# Patient Record
Sex: Female | Born: 1955 | Race: White | Hispanic: No | Marital: Single | Smoking: Former smoker
Health system: Southern US, Community
[De-identification: ages and names within clinical notes are randomized; demographics above are authoritative.]

## PROBLEM LIST (undated history)

## (undated) DIAGNOSIS — C801 Malignant (primary) neoplasm, unspecified: Secondary | ICD-10-CM

## (undated) DIAGNOSIS — E079 Disorder of thyroid, unspecified: Secondary | ICD-10-CM

## (undated) DIAGNOSIS — I509 Heart failure, unspecified: Secondary | ICD-10-CM

## (undated) DIAGNOSIS — I341 Nonrheumatic mitral (valve) prolapse: Secondary | ICD-10-CM

## (undated) DIAGNOSIS — E039 Hypothyroidism, unspecified: Secondary | ICD-10-CM

## (undated) DIAGNOSIS — K2970 Gastritis, unspecified, without bleeding: Secondary | ICD-10-CM

## (undated) DIAGNOSIS — F329 Major depressive disorder, single episode, unspecified: Secondary | ICD-10-CM

## (undated) DIAGNOSIS — J439 Emphysema, unspecified: Secondary | ICD-10-CM

## (undated) DIAGNOSIS — F32A Depression, unspecified: Secondary | ICD-10-CM

## (undated) DIAGNOSIS — F419 Anxiety disorder, unspecified: Secondary | ICD-10-CM

## (undated) HISTORY — DX: Major depressive disorder, single episode, unspecified: F32.9

## (undated) HISTORY — PX: COLON SURGERY: SHX602

## (undated) HISTORY — DX: Anxiety disorder, unspecified: F41.9

## (undated) HISTORY — PX: UPPER GI ENDOSCOPY: SHX6162

## (undated) HISTORY — PX: ILEOSTOMY: SHX1783

## (undated) HISTORY — DX: Depression, unspecified: F32.A

## (undated) HISTORY — PX: THYROID SURGERY: SHX805

## (undated) HISTORY — DX: Heart failure, unspecified: I50.9

## (undated) HISTORY — PX: COLOSTOMY REVERSAL: SHX5782

## (undated) HISTORY — PX: COLOSTOMY: SHX63

## (undated) HISTORY — PX: BLADDER REMOVAL: SHX567

## (undated) HISTORY — PX: COLONOSCOPY: SHX174

## (undated) HISTORY — DX: Malignant (primary) neoplasm, unspecified: C80.1

---

## 2001-05-10 DIAGNOSIS — I219 Acute myocardial infarction, unspecified: Secondary | ICD-10-CM

## 2001-05-10 HISTORY — DX: Acute myocardial infarction, unspecified: I21.9

## 2007-11-30 ENCOUNTER — Ambulatory Visit: Payer: Self-pay | Admitting: Internal Medicine

## 2011-02-02 ENCOUNTER — Inpatient Hospital Stay: Payer: Self-pay | Admitting: Surgery

## 2011-02-04 LAB — CEA: CEA: 1.9 ng/mL (ref 0.0–4.7)

## 2011-02-04 LAB — CA 125: CA 125: 94.3 U/mL — ABNORMAL HIGH (ref 0.0–34.0)

## 2011-03-13 ENCOUNTER — Ambulatory Visit: Payer: Self-pay | Admitting: Urology

## 2011-04-08 ENCOUNTER — Ambulatory Visit: Payer: Self-pay | Admitting: Surgery

## 2011-07-23 ENCOUNTER — Ambulatory Visit: Payer: Self-pay | Admitting: Family Medicine

## 2011-09-06 ENCOUNTER — Emergency Department: Payer: Self-pay | Admitting: *Deleted

## 2011-09-06 LAB — COMPREHENSIVE METABOLIC PANEL
Albumin: 3.3 g/dL — ABNORMAL LOW (ref 3.4–5.0)
Alkaline Phosphatase: 122 U/L (ref 50–136)
BUN: 13 mg/dL (ref 7–18)
Bilirubin,Total: 0.3 mg/dL (ref 0.2–1.0)
Co2: 26 mmol/L (ref 21–32)
EGFR (African American): >60
EGFR (Non-African Amer.): >60
Osmolality: 279 (ref 275–301)
Potassium: 3.7 mmol/L (ref 3.5–5.1)
SGOT(AST): 18 U/L (ref 15–37)
Total Protein: 7.9 g/dL (ref 6.4–8.2)

## 2011-09-06 LAB — CBC
MCH: 28.4 pg (ref 26.0–34.0)
MCHC: 31.9 g/dL — ABNORMAL LOW (ref 32.0–36.0)
MCV: 89 fL (ref 80–100)
Platelet: 829 10*3/uL — ABNORMAL HIGH (ref 150–440)
RBC: 3.19 10*6/uL — ABNORMAL LOW (ref 3.80–5.20)
RDW: 14.8 % — ABNORMAL HIGH (ref 11.5–14.5)

## 2012-06-21 IMAGING — XA DG CHEST 1V
1 series · 1 of 1 positions shown · non-contrast
Comparison: none

REASON FOR EXAM: PICC line Placement
COMMENTS:

PROCEDURE:     VAS - CHEST FRONTAL SINGLE VIEW  - February 19, 2011 [DATE]
RESULT:     Frontal view of the mediastinum is performed.
The patient is status post left sided PICC line placement. The tip is
adequately positioned. The study is otherwise nondiagnostic.

[Series 1: fl - angio · 1 of 1 slices shown]
[im 1/1]
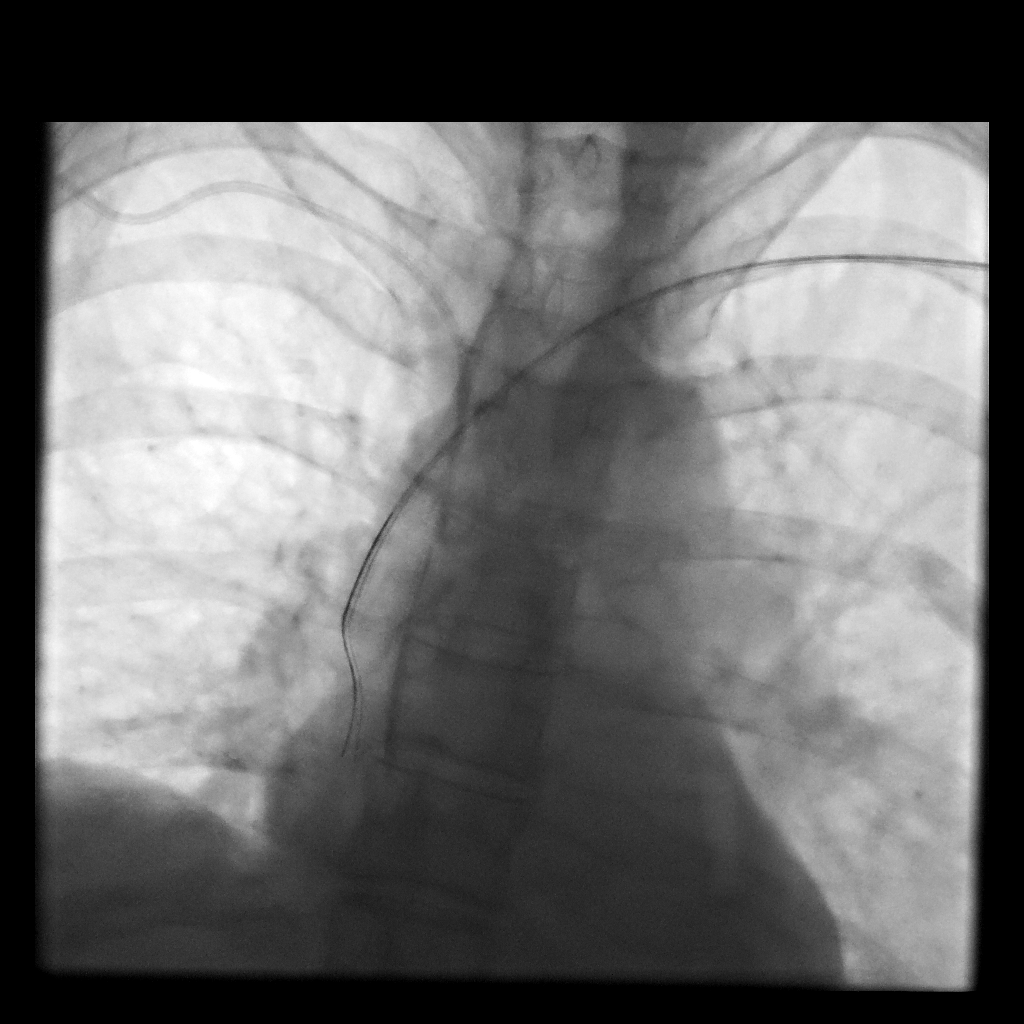

[1 of 1 positions shown; findings below may reference images not displayed]

IMPRESSION: Left sided PICC line placement as described above.

## 2012-07-02 IMAGING — CT CT GUIDED ABCESS DRAINAGE WITH CATHETER
1 of 6 series · 2 of 16 positions shown, 3 images · non-contrast
Comparison: none

REASON FOR EXAM: left upper quadrant fluid collection.
COMMENTS:

[Series 6: soft tissue · axial · 0.78mm/px · z∈[-1014,-946]mm · 2 of 71 slices shown, 3 images]
[im 24/71  soft-tissue]
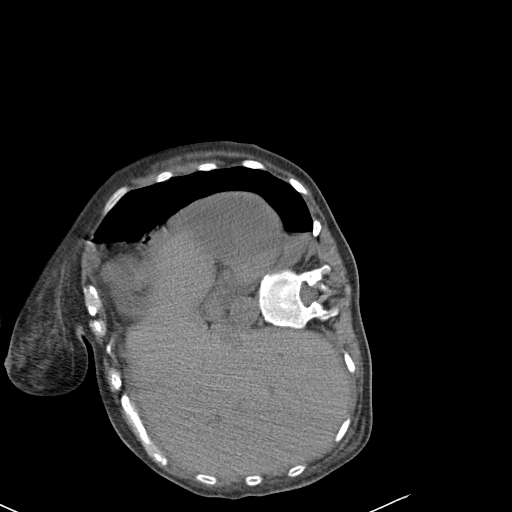
[im 24/71  bone]
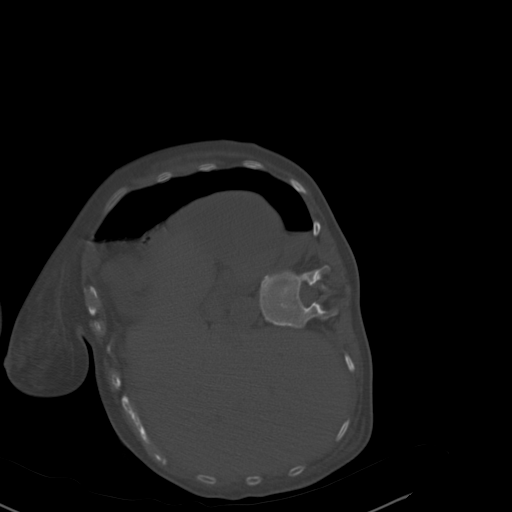
[im 47/71  soft-tissue]
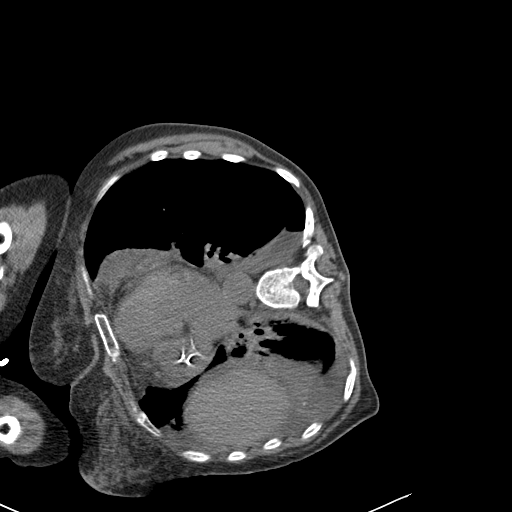

[2 of 16 positions shown; findings below may reference images not displayed]

PROCEDURE:     CT  - CT GUIDED ABSCESS DRAINAGE  - March 02, 2011 [DATE]

RESULT:     Comparisons: CT of the abdomen and pelvis 03/01/2011

Procedure:

Clinical assessment was performed and informed consent obtained.  The
patient was brought to the CT suite and placed left side up decubitus on the
CT table. A focused upper abdominal CT without contrast was performed. This
demonstrated the small subdiaphragmatic fluid collection in the left upper
quadrant, anterior to the spleen. There are small bilateral pleural
effusions. Care was taken to choose an access site that would minimize the
possibility of transgressing the pleural space. However, this was difficult
given its subdiaphragmatic location.

The left upper hemiabdomen was prepped and draped in the usual sterile
fashion. The overlying skin was anesthetized with 1% lidocaine. A 21 gauge
micropuncture needle was inserted and the location confirmed by CT
fluoroscopy. The needle was advanced and placement within the collection
confirmed by CT and syringe aspiration of yellow fluid which was sent for
gram stain and culture.

The 21 gauge needle was exchanged for a 5 French AccuStick catheter over a
wire. Serial dilation was performed with 8 French and 10 French dilators,
followed by placement of a 10 French APDL pigtail catheter into the fluid
collection. The catheter was secured to the skin in the usual fashion. A
postprocedural CT images demonstrated a small left pneumothorax. The patient
was asymptomatic.

The patient was transferred to the recovery unit in stable condition.

Sedation: 0 mg of midazolam and 0 mcg of fentanyl.
IMPRESSION: 1. Successful percutaneous drainage of the subdiaphragmatic fluid collection
in the left upper quadrant.
2. Small left pneumothorax. The patient is asymmetric at this time. This was
discussed with Dr. Rupsu Landgren immediately after the procedure.

## 2012-08-11 ENCOUNTER — Emergency Department: Payer: Self-pay | Admitting: Emergency Medicine

## 2012-08-11 LAB — COMPREHENSIVE METABOLIC PANEL
Anion Gap: 6 — ABNORMAL LOW (ref 7–16)
BUN: 17 mg/dL (ref 7–18)
Bilirubin,Total: 0.3 mg/dL (ref 0.2–1.0)
Calcium, Total: 9.6 mg/dL (ref 8.5–10.1)
Glucose: 87 mg/dL (ref 65–99)
Osmolality: 280 (ref 275–301)
Potassium: 4 mmol/L (ref 3.5–5.1)
SGOT(AST): 28 U/L (ref 15–37)
SGPT (ALT): 25 U/L (ref 12–78)

## 2012-08-11 LAB — CBC
HCT: 37.4 % (ref 35.0–47.0)
HGB: 12.2 g/dL (ref 12.0–16.0)
MCHC: 32.7 g/dL (ref 32.0–36.0)
MCV: 81 fL (ref 80–100)
Platelet: 398 10*3/uL (ref 150–440)
RDW: 16.6 % — ABNORMAL HIGH (ref 11.5–14.5)

## 2012-08-11 LAB — URINALYSIS, COMPLETE
Bilirubin,UR: NEGATIVE
Glucose,UR: NEGATIVE mg/dL (ref 0–75)
Nitrite: NEGATIVE
Protein: NEGATIVE
RBC,UR: 1 /HPF (ref 0–5)
Specific Gravity: 1.006 (ref 1.003–1.030)
WBC UR: <1 /HPF (ref 0–5)

## 2012-08-11 LAB — LIPASE, BLOOD: Lipase: 103 U/L (ref 73–393)

## 2012-08-16 ENCOUNTER — Ambulatory Visit: Payer: Self-pay | Admitting: Unknown Physician Specialty

## 2012-10-19 ENCOUNTER — Ambulatory Visit: Payer: Self-pay | Admitting: Family Medicine

## 2013-04-13 ENCOUNTER — Emergency Department: Payer: Self-pay | Admitting: Emergency Medicine

## 2013-04-13 LAB — URINALYSIS, COMPLETE
Leukocyte Esterase: NEGATIVE
Nitrite: NEGATIVE
Ph: 5 (ref 4.5–8.0)
Protein: NEGATIVE
RBC,UR: 2 /HPF (ref 0–5)
Squamous Epithelial: <1

## 2013-04-13 LAB — CBC WITH DIFFERENTIAL/PLATELET
Eosinophil #: 0.2 10*3/uL (ref 0.0–0.7)
Eosinophil %: 1.5 %
HGB: 13.2 g/dL (ref 12.0–16.0)
Lymphocyte #: 2.2 10*3/uL (ref 1.0–3.6)
MCH: 27.2 pg (ref 26.0–34.0)
MCV: 83 fL (ref 80–100)
Monocyte #: 0.7 x10 3/mm (ref 0.2–0.9)
Monocyte %: 6.4 %
Neutrophil #: 7.7 10*3/uL — ABNORMAL HIGH (ref 1.4–6.5)
Neutrophil %: 71.4 %
RBC: 4.85 10*6/uL (ref 3.80–5.20)
RDW: 18.4 % — ABNORMAL HIGH (ref 11.5–14.5)
WBC: 10.8 10*3/uL (ref 3.6–11.0)

## 2013-04-13 LAB — COMPREHENSIVE METABOLIC PANEL
Alkaline Phosphatase: 98 U/L
Anion Gap: 8 (ref 7–16)
Calcium, Total: 9.2 mg/dL (ref 8.5–10.1)
Co2: 25 mmol/L (ref 21–32)
EGFR (African American): >60
Glucose: 102 mg/dL — ABNORMAL HIGH (ref 65–99)
Potassium: 4.2 mmol/L (ref 3.5–5.1)
Sodium: 139 mmol/L (ref 136–145)
Total Protein: 7.9 g/dL (ref 6.4–8.2)

## 2013-04-23 ENCOUNTER — Other Ambulatory Visit: Payer: Self-pay | Admitting: Unknown Physician Specialty

## 2013-04-23 LAB — CLOSTRIDIUM DIFFICILE(ARMC)

## 2014-09-01 NOTE — Consult Note (Signed)
PATIENT NAME:  Christina Bullock, Christina Bullock MR#:  [id number] DATE OF BIRTH:  10-29-1955  DATE OF CONSULTATION:  09/07/2011  REFERRING PHYSICIAN:   CONSULTING PHYSICIAN:  Micheline Maze, MD  PRIMARY CARE PHYSICIAN: Bernie Covey, MD   BRIEF HISTORY: Christina Bullock is a 59 year old woman seen in the Emergency Room with complaint of drainage and bleeding from her midline abdominal wound. She has a complicated medical history. In September of 2012 she presented to the Emergency Room with acute abdomen and underwent surgical intervention for what was felt to be a large pelvic abscess, possibly a colon tumor. She had a Hartmann's procedure performed. At the time of surgery, the bladder appeared to be involved in the process and she developed a bladder dome injury. She had extensive lysis of adhesions, had two small bowel resections with primary anastomosis and drainage of the large pelvic abscess. 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 She had an extremely complicated postoperative course and was hospitalized for almost eight weeks. She underwent long-term TPN and GI rest and eventually healed both fistulas. She was then referred to the [place] at [facility] where three weeks ago she underwent a reconstructive procedure. Those records were unavailable to me but in the patient's words she had her sigmoid colon removed, her colostomy reversed, protective ileostomy performed and a neobladder created. Her Foley was recently removed and she has been doing well postdischarge. This evening she noted the urge to void and then developed some bloody and clear drainage from her lower midline. She called the Help Line at [facility] and was advised to seek help at the nearest Emergency Room for urgent evaluation. The question of possible recurrent fistula was considered.   She denies any fever or chills. She has been doing well otherwise at home, eating well, gaining weight. She  does not have any other constitutional symptoms. Her bowel function has been a problem because of the ileostomy not sealing well. She is up and ambulatory and living by herself at the present time.   CURRENT MEDICATIONS:  1. Atarax. 2. Marinol. 3. Ultram. 4. Vicodin.   ALLERGIES: She is allergic to Benadryl, Cipro, codeine, Demerol, Effexor, epinephrine, and erythromycin.   SOCIAL HISTORY: She denies cigarette smoking and does not use any alcohol at the present time.   The remainder of her history is well documented in her most recent hospitalization.   PHYSICAL EXAMINATION:   VITAL SIGNS: Blood pressure 98/64, heart rate 82 and regular. She is afebrile.   HEENT: No scleral icterus. No facial deformity. Pupils were equally round.   NECK: Supple without adenopathy and her trachea is midline. I could not palpate her thyroid gland.    CHEST: Clear without adventitious sounds and has normal pulmonary excursion.   CARDIAC: No murmurs or gallops to my ear. She seems to be in normal sinus rhythm.   ABDOMEN: Left upper quadrant gastrostomy. She has an ileostomy on the right side. Her wound is erythematous and soupy looking. There does appear to be a superficial wound infection. No obvious drainage is noted and there is no fistulous connection noted. She does have some inflammatory changes in the colostomy site as well.   EXTREMITIES: Lower extremity exam reveals full range of motion. She is quite weak and has difficulty standing without assistance.   This woman recently underwent surgery for extensive GI and genitourinary reconstruction in [facility]. She called the [place] and was told to come urgently  to the hospital for some sort of imaging study to rule out fistula. I am not certain what imaging study we can perform in the middle of the night that will help [place] identify the possibility of a recurrent fistula. She does not have any other significant symptoms at the  present time. White blood cell count is 12,000. Her hemoglobin is 9, platelet count is 830,000. Electrolytes are unremarkable. She is afebrile and has no pathologic findings. Her abdomen revealed what appeared to be a wound infection. She is set up for an appointment in [facility] tomorrow for follow-up. We suggested giving her methylene blue or Pyridium in an effort to stay in her urine to see if she is draining from her midline but she has refused at the present time saying that she has voided since being in the Emergency Room and has not had any further drainage. I suspect this drainage is a small wound infection but it is possible that she has developed another fistula. 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 She does have a friend with her who is looking out for her and they are in agreement that they will go home this evening and keep her appointment tomorrow at [facility].   I have reviewed our office notes extensively and we do not have a follow-up to their most recent hospitalization and surgical intervention. I do not see any indication for urgent surgical intervention or for hospitalization at the present time. They are in agreement with this plan.   ____________________________ Micheline Maze, MD rle:drc D: 09/07/2011 01:25:14 ET T: 09/07/2011 08:47:46 ET JOB#: [id number]  cc: Micheline Maze, MD, <Dictator> Valetta Close, MD Rodena Goldmann MD ELECTRONICALLY SIGNED 09/07/2011 19:31

## 2015-05-26 ENCOUNTER — Emergency Department
Admission: EM | Admit: 2015-05-26 | Discharge: 2015-05-26 | Disposition: A | Discharge status: Home / Self Care | Payer: Medicare HMO | Attending: Emergency Medicine | Admitting: Emergency Medicine

## 2015-05-26 ENCOUNTER — Encounter: Payer: Self-pay | Admitting: *Deleted

## 2015-05-26 ENCOUNTER — Emergency Department: Payer: Medicare HMO

## 2015-05-26 DIAGNOSIS — R109 Unspecified abdominal pain: Secondary | ICD-10-CM | POA: Insufficient documentation

## 2015-05-26 DIAGNOSIS — F17200 Nicotine dependence, unspecified, uncomplicated: Secondary | ICD-10-CM | POA: Insufficient documentation

## 2015-05-26 DIAGNOSIS — R1010 Upper abdominal pain, unspecified: Secondary | ICD-10-CM | POA: Diagnosis present

## 2015-05-26 DIAGNOSIS — R51 Headache: Secondary | ICD-10-CM | POA: Insufficient documentation

## 2015-05-26 HISTORY — DX: Nonrheumatic mitral (valve) prolapse: I34.1

## 2015-05-26 HISTORY — DX: Disorder of thyroid, unspecified: E07.9

## 2015-05-26 LAB — COMPREHENSIVE METABOLIC PANEL
ALK PHOS: 71 U/L (ref 38–126)
ALT: 24 U/L (ref 14–54)
AST: 34 U/L (ref 15–41)
Albumin: 4.5 g/dL (ref 3.5–5.0)
Anion gap: 11 (ref 5–15)
BUN: 10 mg/dL (ref 6–20)
CHLORIDE: 106 mmol/L (ref 101–111)
CO2: 24 mmol/L (ref 22–32)
Calcium: 9.4 mg/dL (ref 8.9–10.3)
Creatinine, Ser: 0.82 mg/dL (ref 0.44–1.00)
GFR calc non Af Amer: >60 mL/min (ref >60)
GLUCOSE: 101 mg/dL — AB (ref 65–99)
Potassium: 3.7 mmol/L (ref 3.5–5.1)
SODIUM: 141 mmol/L (ref 135–145)
Total Bilirubin: 0.6 mg/dL (ref 0.3–1.2)
Total Protein: 7.7 g/dL (ref 6.5–8.1)

## 2015-05-26 LAB — CBC WITH DIFFERENTIAL/PLATELET
Basophils Absolute: 0.1 10*3/uL (ref 0–0.1)
Basophils Relative: 1 %
Eosinophils Absolute: 0.2 10*3/uL (ref 0–0.7)
Eosinophils Relative: 2 %
HCT: 41.8 % (ref 35.0–47.0)
HEMOGLOBIN: 13.8 g/dL (ref 12.0–16.0)
LYMPHS PCT: 21 %
Lymphs Abs: 1.9 10*3/uL (ref 1.0–3.6)
MCH: 29.9 pg (ref 26.0–34.0)
MCHC: 33 g/dL (ref 32.0–36.0)
MCV: 90.7 fL (ref 80.0–100.0)
MONOS PCT: 5 %
Monocytes Absolute: 0.4 10*3/uL (ref 0.2–0.9)
Neutro Abs: 6.9 10*3/uL — ABNORMAL HIGH (ref 1.4–6.5)
Neutrophils Relative %: 73 %
Platelets: 314 10*3/uL (ref 150–440)
RBC: 4.61 MIL/uL (ref 3.80–5.20)
RDW: 13.8 % (ref 11.5–14.5)
WBC: 9.5 10*3/uL (ref 3.6–11.0)

## 2015-05-26 LAB — LIPASE, BLOOD: Lipase: 20 U/L (ref 11–51)

## 2015-05-26 LAB — TROPONIN I

## 2015-05-26 MED ORDER — BARIUM SULFATE 2.1 % PO SUSP: 450.0000 mL | ORAL | Status: AC

## 2015-05-26 MED ORDER — IOHEXOL 240 MG/ML SOLN: 25.0000 mL | INTRAMUSCULAR | Status: AC

## 2015-05-26 NOTE — ED Notes (Signed)
AAOx3.  Skin warm and dry.  Moving all extremities equally and strong. Ambulates with easy and steady gait.   

## 2015-05-26 NOTE — Discharge Instructions (Signed)
Please seek medical attention for any high fevers, chest pain, shortness of breath, change in behavior, persistent vomiting, bloody stool or any other new or concerning symptoms. ° ° °Abdominal Pain, Adult °Many things can cause abdominal pain. Usually, abdominal pain is not caused by a disease and will improve without treatment. It can often be observed and treated at home. Your health care provider will do a physical exam and possibly order blood tests and X-rays to help determine the seriousness of your pain. However, in many cases, more time must pass before a clear cause of the pain can be found. Before that point, your health care provider may not know if you need more testing or further treatment. °HOME CARE INSTRUCTIONS °Monitor your abdominal pain for any changes. The following actions may help to alleviate any discomfort you are experiencing: °· Only take over-the-counter or prescription medicines as directed by your health care provider. °· Do not take laxatives unless directed to do so by your health care provider. °· Try a clear liquid diet (broth, tea, or water) as directed by your health care provider. Slowly move to a bland diet as tolerated. °SEEK MEDICAL CARE IF: °· You have unexplained abdominal pain. °· You have abdominal pain associated with nausea or diarrhea. °· You have pain when you urinate or have a bowel movement. °· You experience abdominal pain that wakes you in the night. °· You have abdominal pain that is worsened or improved by eating food. °· You have abdominal pain that is worsened with eating fatty foods. °· You have a fever. °SEEK IMMEDIATE MEDICAL CARE IF: °· Your pain does not go away within 2 hours. °· You keep throwing up (vomiting). °· Your pain is felt only in portions of the abdomen, such as the right side or the left lower portion of the abdomen. °· You pass bloody or black tarry stools. °MAKE SURE YOU: °· Understand these instructions. °· Will watch your  condition. °· Will get help right away if you are not doing well or get worse. °  °This information is not intended to replace advice given to you by your health care provider. Make sure you discuss any questions you have with your health care provider. °  °Document Released: 02/03/2005 Document Revised: 01/15/2015 Document Reviewed: 01/03/2013 °Elsevier Interactive Patient Education ©2016 [employer] ° °

## 2015-05-26 NOTE — ED Notes (Signed)
Early am c/o ha, nausea, had sm stool, has had 15 oz golightly, i passing some green liquid rectally, has had a lot of colon surgies

## 2015-05-26 NOTE — ED Provider Notes (Signed)
[facility] Emergency Department Provider Note   ____________________________________________  Time seen: 1715  I have reviewed the triage vital signs and the nursing notes.   HISTORY  Chief Complaint Abdominal Pain   History limited by: Not Limited   HPI Christina Bullock is a 60 y.o. female with history of multiple previous colon surgeries who presents to the emergency department today because of concern for abdominal pain and decreased defecation. The patient states that the pain woke her up at about 1:30 AM. The patient states it is located across the upper part of her abdomen. She describes it as tearing. It is severe. She noted a small amount of defecation today. It was very thin. She states that she tried taking some GoLYTELY which was suggested by her doctor. She states that this is not significantly increased output. She states she is concerned she might have another blockage. She has had this in the past. No measured fevers although she felt hot and sweaty this morning.     Past Medical History  Diagnosis Date  . Thyroid disease   . Mitral prolapse     There are no active problems to display for this patient.   Past Surgical History  Procedure Laterality Date  . Colostomy    . Colostomy reversal    . Colon surgery    . Bladder removal    . Ileostomy    . Thyroid surgery      No current outpatient prescriptions on file.  Allergies Phenylephrine; Albuterol-ipratropium-soybean lecithin; Benadryl; Codeine; Effexor; Erythromycin; Other; Sulfa antibiotics; Ciprofloxacin; Demerol; Floxin; and Stadol  No family history on file.  Social History Social History  Substance Use Topics  . Smoking status: Current Every Day Smoker  . Smokeless tobacco: None  . Alcohol Use: No    Review of Systems  Constitutional: Negative for fever. Cardiovascular: Negative for chest pain. Respiratory: Negative for shortness of  breath. Gastrointestinal: Positive for abdominal pain Neurological: Positive for headache   10-point ROS otherwise negative.  ____________________________________________   PHYSICAL EXAM:  VITAL SIGNS: ED Triage Vitals  Enc Vitals Group     BP 05/26/15 1458 155/65 mmHg     Pulse Rate 05/26/15 1458 69     Resp 05/26/15 1458 20     Temp 05/26/15 1458 97.6 F (36.4 C)     Temp Source 05/26/15 1458 Oral     SpO2 05/26/15 1458 95 %     Weight 05/26/15 1458 180 lb (81.647 kg)     Height 05/26/15 1458 5\' 6"  (1.676 m)     Head Cir --      Peak Flow --      Pain Score 05/26/15 1459 6   Constitutional: Alert and oriented. Well appearing and in no distress. Eyes: Conjunctivae are normal. PERRL. Normal extraocular movements. ENT   Head: Normocephalic and atraumatic.   Nose: No congestion/rhinnorhea.   Mouth/Throat: Mucous membranes are moist.   Neck: No stridor. Hematological/Lymphatic/Immunilogical: No cervical lymphadenopathy. Cardiovascular: Normal rate, regular rhythm.  No murmurs, rubs, or gallops. Respiratory: Normal respiratory effort without tachypnea nor retractions. Breath sounds are clear and equal bilaterally. No wheezes/rales/rhonchi. Gastrointestinal: Soft. Somewhat diffusely tender. Tympanitic. Genitourinary: Deferred Musculoskeletal: Normal range of motion in all extremities. No joint effusions.  No lower extremity tenderness nor edema. Neurologic:  Normal speech and language. No gross focal neurologic deficits are appreciated.  Skin:  Skin is warm, dry and intact. No rash noted. Psychiatric: Mood and affect are normal. Speech and behavior are  normal. Patient exhibits appropriate insight and judgment.  ____________________________________________    LABS (pertinent positives/negatives)  Labs Reviewed  COMPREHENSIVE METABOLIC PANEL - Abnormal; Notable for the following:    Glucose, Bld 101 (*)    All other components within normal limits  CBC WITH  DIFFERENTIAL/PLATELET - Abnormal; Notable for the following:    Neutro Abs 6.9 (*)    All other components within normal limits  LIPASE, BLOOD  TROPONIN I     ____________________________________________   EKG  None  ____________________________________________    RADIOLOGY  CT abd/pel IMPRESSION: No acute abnormalities other than findings consistent with clinical history of diarrhea. No evidence of obstruction.  ____________________________________________   PROCEDURES  Procedure(s) performed: None  Critical Care performed: No  ____________________________________________   INITIAL IMPRESSION / ASSESSMENT AND PLAN / ED COURSE  Pertinent labs & imaging results that were available during my care of the patient were reviewed by me and considered in my medical decision making (see chart for details).  Patient presented to the emergency department today because of concerns for abdominal pain. Patient has multiple abdominal surgeries in the past. Abdominal exam was somewhat concerning for obstruction type picture given tympany. CT abdomen and pelvis however does not show any signs of obstruction or other concerning intra-abdominal apology. Discussed the finding with the patient. Discussed importance of following up. Patient will be discharged home.  ____________________________________________   FINAL CLINICAL IMPRESSION(S) / ED DIAGNOSES  Final diagnoses:  Abdominal pain, unspecified abdominal location     Nance Pear, MD 05/26/15 2116

## 2016-12-30 ENCOUNTER — Ambulatory Visit: Payer: Medicare HMO | Attending: Gastroenterology | Admitting: Physical Therapy

## 2016-12-30 ENCOUNTER — Encounter: Payer: Self-pay | Admitting: Physical Therapy

## 2016-12-30 DIAGNOSIS — R278 Other lack of coordination: Secondary | ICD-10-CM | POA: Diagnosis present

## 2016-12-30 DIAGNOSIS — R2689 Other abnormalities of gait and mobility: Secondary | ICD-10-CM | POA: Diagnosis not present

## 2016-12-30 NOTE — Patient Instructions (Addendum)
  Avoid straining pelvic floor, abdominal muscles , spine  Use log rolling technique instead of getting out of bed with your neck or the sit-up   Log rolling out of .bed  L  arm overhead  Raise hips and scoot hips to R   Drop knees to L,  scooting L shoulder back to get completely on your L side so your shoulders, hips, and knees point to the L    Then breathe as you drop feet off bed and prop onto L elbow and  use both hands to push yourself   ---- SCOOT in bed   Pressing elbows and feet down, "Table legs"  Lift hips and scoot over incrementally     _______  Laying on R  ( open book )  Roll 3/4 turn to the L onto pillow behind back  Pillow between knees  10    Seated, twisting to the R   Inhale, lengthen spine Exhale twist to the R  10

## 2016-12-30 NOTE — Therapy (Addendum)
[place] MAIN [facility] SERVICES [address] [place], [place], [address] Phone: [phone]   Fax:  [phone]  Physical Therapy Evaluation  Patient Details  Name: Christina Bullock MRN: [id number] Date of Birth: 02/05/1956 Referring Provider: Clovis Cao   Encounter Date: 12/30/2016      PT End of Session - 12/30/16 1211    Visit Number 1   Number of Visits 12   Date for PT Re-Evaluation 03/31/17   Authorization Type g code    PT Start Time 1110   PT Stop Time 1211   PT Time Calculation (min) 61 min   Activity Tolerance Patient tolerated treatment well   Behavior During Therapy [facility] for tasks assessed/performed      Past Medical History:  Diagnosis Date  . Anxiety   . Cancer (HCC)    endometrial  . Depression   . Mitral prolapse   . Thyroid disease     Past Surgical History:  Procedure Laterality Date  . BLADDER REMOVAL    . COLON SURGERY    . COLOSTOMY    . COLOSTOMY REVERSAL    . ILEOSTOMY    . THYROID SURGERY      There were no vitals filed for this visit.       Subjective Assessment - 12/30/16 1123    Subjective Pt has constipation every couple of days and then has diarrhea. Pt also experiences fecal leakage. The last few months, pt feel upside down with her bowels and she is trying to build back up from a liquid diet to more solid foods. Solid foods give her explosive runs after 2-3 hours.  Pt is not working with a nutritionist and has asked her providers for a referral.  Pt takes laxatives but prefers to take aloe vera.  These Sx have been going on since undergoing 9 abdominal surgeries. Her GI track now consists of  3 ft of the colon remains, small intestines portion removed, Pt no longer uses a colostomy bag and found it was much easier when she did. The surgeries started because she had endometrial cancer and endometriosis in 1980s. 2) urinary dyfsunction: pt 's bladder deteriorated due to an infection 2012 and pt had  a reconstructed bladder. urinary frequency occurs once every 20 min, pt does not use a catheter. Pt is not able to empty completely.        Pertinent History pt reports having dizzy spells and states she does not have good balance. Dr. Clemmie Krill is monitoring her dizzy spells. Pt was gone through tests and she has been told that she is dehydrated. Pt states her kidney function is down and since it is hard for her to absorb food and liquids due her 9 abdominal surgeries. At this point doctors have r/o cardiac and enurological sources for her dizziness.  They are looking into her abdominal  issues and will be running more tests of her GI system.  Somedays she gets light headed.     Patient Stated Goals lose weight, have a normally functioning body             [facility] PT Assessment - 12/30/16 1203      Assessment   Medical Diagnosis constipation and outlet dyfunction    Referring Provider Kroch      Precautions   Precautions None     Restrictions   Weight Bearing Restrictions No     Balance Screen   Has the patient fallen in the past 6 months No  Coordination   Gross Motor Movements are Fluid and Coordinated --  limited posterior excursion of ribcage    Fine Motor Movements are Fluid and Coordinated --  adductor overuse on R with cue for pelvic floor contraction      Posture/Postural Control   Posture Comments lumbopelvic pertubations with leg movements in hooklying     Palpation   Spinal mobility WFL, tight thoracic muscles B      Bed Mobility   Rolling Right --  less pain laying on R    Rolling Left --  half crunch, pain with L sidelying      Ambulation/Gait   Gait Comments R steppage gait pattern            Objective measurements completed on examination: See above findings.          [place] PT Treatment/Exercise - 12/30/16 1203      Therapeutic Activites    Therapeutic Activities --  see pt instructions and proper technique     Manual Therapy    Manual therapy comments STM with MWM open book L                 PT Education - 12/30/16 1211    Education provided Yes   Education Details POC, anatomy. physiology, HEP, goals, body mechanics    Person(s) Educated Patient   Methods Explanation;Demonstration;Tactile cues;Verbal cues;Handout   Comprehension Verbalized understanding;Returned demonstration;Verbal cues required;Tactile cues required             PT Long Term Goals - 12/30/16 1306      PT LONG TERM GOAL #1   Title Pt will decrease her COREFO score from 39% to < 34% in order to improve QOL   Time 12   Period Weeks   Status New   Target Date 03/24/17     PT LONG TERM GOAL #2   Title Pt will demo proper mechanics with bed rolling in order to minimize strain on abdominopelvic mm and decrease excerbation of Sx1   Time 12   Period Weeks   Status New     PT LONG TERM GOAL #3   Title Pt will be complete a food/ bowel diary and be provided nutritionist contact and  information   Time 4   Period Weeks   Status New   Target Date 01/27/17     PT LONG TERM GOAL #4   Title Pt will demo proper pelvic floor ROM and no lumbopelvic perturbations with deep core level 2 ( 5 reps) in order to improve intrrabdominal pressure for motility and core strength   Time 12   Period Weeks   Status New   Target Date 03/31/17     PT LONG TERM GOAL #5   Title Pt will report no pain with L sidelying in order to improve sleep   Time 12   Period Weeks   Status New   Target Date 03/31/17                Plan - 12/30/16 1321    Clinical Impression Statement Pt is a 61 yo female who reports fecal leakage, constipation, diarrhea 2/2 nine abdominal surgeries. These deficits impact her ADLs and QOL. Pt's clinical presentations include weak deep core mm. dyscoordination of pelvic floor, limited spinal mobility at thoracic spine, and poor body mecahncis which place strain on her abdomen and pelvic floor, and gait deviations.  Following Tx, pt demo'd increased diaphragmatic excursion to optimize deep core system,  decreased throcic mm tensions, and improved body mechanics in bed mobility to minimize abdomino-pelvic strain. Plan to further assess pelvic floor at future session     History and Personal Factors relevant to plan of care: smoker, multiple abdominal surgeries, work includes stocking, lifting , standing    Clinical Presentation Unstable   Clinical Decision Making High   Rehab Potential Good   PT Frequency 1x / week   PT Duration 12 weeks   PT Treatment/Interventions Moist Heat;Traction;Ultrasound;Therapeutic activities;Functional mobility training;Therapeutic exercise;Patient/family education;Neuromuscular re-education;Manual techniques;Taping;Scar mobilization;Balance training   Consulted and Agree with Plan of Care Patient      Patient will benefit from skilled therapeutic intervention in order to improve the following deficits and impairments:  Decreased safety awareness, Decreased strength, Decreased range of motion, Decreased endurance  Visit Diagnosis: Other abnormalities of gait and mobility -   Other lack of coordination       G-Codes - 2017-01-02 1330    Functional Assessment Tool Used (Outpatient Only) COREFO 39%, clinical judgement   Functional Limitation Self care   Self Care Current Status (A5790) At least 40 percent but less than 60 percent impaired, limited or restricted   Self Care Goal Status (X8333) At least 20 percent but less than 40 percent impaired, limited or restricted       Problem List There are no active problems to display for this patient.   Jerl Mina ,PT, DPT, E-RYT  Jan 02, 2017, 3:34 PM  [place] MAIN [facility] SERVICES [address] [place], [place], [address] Phone: [phone]   Fax:  [phone]  Name: Samiya Mervin MRN: [id number] Date of Birth: 1955-05-23

## 2016-12-30 NOTE — Addendum Note (Signed)
Addended by: Jerl Mina on: 12/30/2016 03:45 PM   Modules accepted: Orders

## 2017-01-03 ENCOUNTER — Ambulatory Visit: Payer: Medicare HMO | Admitting: Physical Therapy

## 2017-01-12 ENCOUNTER — Encounter: Payer: Self-pay | Admitting: Physical Therapy

## 2017-01-12 NOTE — Therapy (Signed)
[place] MAIN [facility] SERVICES [address] [place], [place], [address] Phone: [phone]   Fax:  [phone]  Patient Details  Name: Christina Bullock MRN: [id number] Date of Birth: Oct 15, 1955 Referring Provider:  Dr. Clovis Cao   Encounter Date: 01/12/2017  Discharge Summary   Pt is self-discharge from PT after her 1st appt. Pt stated to the front desk staff that she would like cancel all of her remaining appts due to her job.   Thank you for your referral!    Jerl Mina ,PT, DPT, E-RYT  01/12/2017, 9:22 AM  [place] [address] [place], [place], [address] Phone: [phone]   Fax:  [phone]

## 2017-01-13 ENCOUNTER — Ambulatory Visit: Payer: Medicare HMO | Admitting: Physical Therapy

## 2017-01-20 ENCOUNTER — Ambulatory Visit: Payer: Medicare HMO | Admitting: Physical Therapy

## 2017-01-27 ENCOUNTER — Ambulatory Visit: Payer: Medicare HMO | Admitting: Physical Therapy

## 2017-02-10 ENCOUNTER — Encounter: Payer: Medicare HMO | Admitting: Physical Therapy

## 2017-02-24 ENCOUNTER — Encounter: Payer: Medicare HMO | Admitting: Physical Therapy

## 2017-03-10 ENCOUNTER — Encounter: Payer: Medicare HMO | Admitting: Physical Therapy

## 2017-06-15 ENCOUNTER — Encounter: Payer: Medicare HMO | Attending: Family Medicine | Admitting: Dietician

## 2017-06-15 ENCOUNTER — Encounter: Payer: Self-pay | Admitting: Dietician

## 2017-06-15 VITALS — Ht 67.0 in | Wt 179.8 lb

## 2017-06-15 DIAGNOSIS — K5902 Outlet dysfunction constipation: Secondary | ICD-10-CM

## 2017-06-15 DIAGNOSIS — E663 Overweight: Secondary | ICD-10-CM | POA: Diagnosis not present

## 2017-06-15 DIAGNOSIS — E039 Hypothyroidism, unspecified: Secondary | ICD-10-CM | POA: Insufficient documentation

## 2017-06-15 DIAGNOSIS — Z713 Dietary counseling and surveillance: Secondary | ICD-10-CM | POA: Diagnosis not present

## 2017-06-15 NOTE — Patient Instructions (Addendum)
   Small, frequent meals. Five to six small meals a day makes it easier to meet a person's caloric and nutrient requirements.   Liquid between instead of with meals. Drinking with meals can increase transit time leading to diarrhea, and may limit a person's appetite with a feeling of fullness  High protein, low simple sugars. Protein tends to slow the transit time of food, whereas simple sugars like juices or sweet foods can contribute to diarrhea   Low fat (especially for people experiencing steatorrhea or people missing their terminal ileum). If fat malabsorption is occurring, supplements of vitamins A, D, E, and K are also recommended. Low oxalate (for people missing their terminal ileum). High oxalate foods include: strawberries, spinach, rhubarb, chocolate (70% or higher), beets, tea, nuts, and wheat bran  Foods that may control diarrhea are also recommended. These include bananas, oatmeal, rice, bread, tapioca, applesauce, yogurt, etc.   Multivitamin; magnesium, calcium, and iron supplements (if duodenum removed); Vitamin B12 (if terminal ileum removed) *liquid versions are best absorbed   Sodium, Potassium, Chloride, Magnesium = electrolytes to include in your daily diet (low sugar drink varieties)   Steamed, broiled, baking, grilled are preferred cooking methods over fried or breaded  Guidelines for low sugar: as close to the single digits per serving

## 2017-06-15 NOTE — Progress Notes (Signed)
Medical Nutrition Therapy: Visit start time: 1330  end time: 4431  Assessment:  Diagnosis: constipation Past medical history: GERD, Hypothyroid, Partial colectomy, Endometrial cancer (per patient report), diarrhea and IBS (per patient report), anxiety Psychosocial issues/ stress concerns: none Preferred learning method:  . Auditory . Hands-on  Current weight: 179.8lb (with shoes)  Height: 5\' 7"  Medications, supplements: Synthroid, Vitamin D3  Progress and evaluation: Patient has been struggling with GI symptoms such as constipation and diarrhea since 2012 when she reports her bowels to have ruptured. H/o IBS and endometrial cancer prior to this event x30 years. After ruptured bowel she repots undergoing an ileostomy and received nourishment via EN for a short period of time. Wt loss of 60# at this time which has been regained. Had part of her colon and small intestine removed at some point as well. Now eats "whatever she can tolerate" which for the past several months has been mainly saltine crackers, cheese, animal crackers, graham crackers, lunch meat and the occasional TV dinner. Feels that processed foods, in theory, should be easier for her body to tolerate. Describes sx of steatorhea. Does not tolerate most fruits and vegetables well currently but has been experimenting with different preparation methods. Typically eats 1 meal and a couple snacks per day. Times meal around work schedule d/t unpredictability of bowel movements after eating. Has kept a food journal in the past. She purchased a children's liquid MVI and [employer] brand Vitamin B12 which she plans to start taking regularly along with the Vitamin D3 which she already takes.  Physical activity: 2 miles/ day  Dietary Intake:  Usual eating pattern includes 1-2 meals and 1-2 snacks per day. Dining out frequency: 0-2 meals per week.  Breakfast: egg + toast (not regularly) Snack: n/a Lunch @3pm : lunch meat + cheese Snack:  n/a Supper: TV dinner, ham sandwich on white bread Snack: chocolate, graham crackers, animal crackers Beverages: water, coffee  Nutrition Care Education: Topics covered: qualifications for low fat and low sugar foods when reading nutrition facts labels, guidelines for foods and beverage to consume and avoid with absorption disorder/ bowel removal and restructure, eating patterns for maximum nutrient absorption, importance of protein in the diet, supplement guidelines, identified electrolytes and where to find them, liquid vs. Pill supplements Basic nutrition: basic food groups, appropriate nutrient balance, appropriate meal and snack schedule, general nutrition guidelines    Advanced nutrition: cooking techniques, food label reading Hyperlipidemia: healthy and unhealthy fats, role of fiber Other lifestyle changes:  benefits of making changes, readiness for change, identifying habits that need to change, food and drug interactions  Nutritional Diagnosis:  [place]-2.1 Inpaired nutrition utilization As related to parital colectomy and small bowel resection.  As evidenced by deficiencies in fat soluble vitamins and vitamin b12.  Intervention: Discussion as noted above. Patient will work on starting vitamins as dicussed and will start to read nutrition facts labels to identify "problem" ingredients as well as products high in fat and sugar. Overall she will start to work on eating less processed foods until her follow up appointment on March 6th, 2019.  Education Materials given: Mar[place] Kitchen Goals/ instructions  Learner/ who was taught:  . Patient  Level of understanding: Mar[place] Kitchen Verbalizes/ demonstrates competency  Demonstrated degree of understanding via:   Teach back Learning barriers: . None  Willingness to learn/ readiness for change: . Eager, change in progress  Monitoring and Evaluation:  Dietary intake, bowel habits and body weight      follow up: in 4 week(s): March  6th

## 2017-07-13 ENCOUNTER — Encounter: Payer: Medicare HMO | Attending: Family Medicine | Admitting: Dietician

## 2017-07-13 ENCOUNTER — Encounter: Payer: Self-pay | Admitting: Dietician

## 2017-07-13 VITALS — Wt 186.5 lb

## 2017-07-13 DIAGNOSIS — E663 Overweight: Secondary | ICD-10-CM | POA: Insufficient documentation

## 2017-07-13 DIAGNOSIS — E039 Hypothyroidism, unspecified: Secondary | ICD-10-CM | POA: Insufficient documentation

## 2017-07-13 DIAGNOSIS — K5902 Outlet dysfunction constipation: Secondary | ICD-10-CM

## 2017-07-13 DIAGNOSIS — Z9049 Acquired absence of other specified parts of digestive tract: Secondary | ICD-10-CM | POA: Diagnosis not present

## 2017-07-13 DIAGNOSIS — Z713 Dietary counseling and surveillance: Secondary | ICD-10-CM | POA: Insufficient documentation

## 2017-07-13 DIAGNOSIS — N183 Chronic kidney disease, stage 3 (moderate): Secondary | ICD-10-CM | POA: Insufficient documentation

## 2017-07-13 NOTE — Progress Notes (Signed)
Medical Nutrition Therapy: Visit start time: 1500  end time: 1600  Assessment:  Diagnosis: short bowel syndrome Medical history changes: none Psychosocial issues/ stress concerns: none  Current weight: 186.5lb  Height: 5\' 7"   Medications, supplement changes: started getting Vitamin B12 injections per pt report  Progress and evaluation: Patient reports to have decreased the amount of processed foods she is consuming, and has been diligent about reading nutrition facts labels. She kept and brought a food diary to today's session which reveals several snack foods / desserts but increased variety overall. She has been making homemade soups frequently and made low- sugar cookies. She also tried and tolerated [place] bars and RX bars which can be brought to work. Cheese and crackers remains a staple in her diet. Feels that she eats "like a kid" but has made some positive changes and more nutritious choices. She has started to incorporate smaller, more frequent meals which she is learning to adjust around her new work schedule. Reports less frequent bowel movements and stools are larger / more formed since making these changes. Some GI discomfort noted. She has not yet started supplementing her diet with Vitamins discussed, as she needed more guidance on which specific vitamins to look for which was clarified today.   Physical activity: walks 2 miles/ day  Dietary Intake:  Usual eating pattern includes 2-3 meals and 1-3 snacks per day. Dining out frequency: 0-1 meals per week.  Food diary reveals recent foods to include: chocolate, cheese and crackers, soups, pizza, snack foods, chicken, select vegetables, eggs occasionally, pop tarts, peanut butter crackers, bars such as Dionne Milo or RX brand  Nutrition Care Education: Topics covered: *Food Diary Weight management guidelineslow fat and GI patterns after eating, dietary sources of protein and Vitamin B12, foods that contain fiber, whole grains, increasing food  variety, whole foods vs processed foods, sources of heart-healthy fats, ways to decrease fat of meats when buying and cooking Basic nutrition: basic food groups, appropriate nutrient balance, appropriate meal and snack schedule, general nutrition guidelines Other lifestyle changes: benefits of making changes, increasing motivation, readiness for change, identifying habits that need to change  Nutritional Diagnosis:  [place]-2.1 Inpaired nutrition utilization As related to partial colectomy and small bowel resection.  As evidenced by patient report of irregular bowel patterns, sx of malabsorption, and deficiencies in Vitamin B12 and fat- soluble vitamins.  Intervention: Discussion as noted above. Patient will continue to consume less processed foods, increase diet variety, and slowly increase intake of dietary fiber / protein. She plans to start taking the supplements discussed regularly. She will also continue to read nutrition facts labels and identify foods high in added sugars, high in fat, moderate in fiber and high in protein. States she already feels better after starting her Vitamin B12 injections. Changes in bowel movements and patterns will be noted.  Education Materials given:  [place] Kitchen Goals/ instructions  Learner/ who was taught:  . Patient  Level of understanding: [place] Kitchen Verbalizes/ demonstrates competency  Demonstrated degree of understanding via:   Teach back Learning barriers: . None  Willingness to learn/ readiness for change: . Eager, change in progress  Monitoring and Evaluation:  Dietary intake, GI patterns, and body weight      follow up: in 6 week(s): April 17th

## 2017-07-13 NOTE — Patient Instructions (Addendum)
   Keep your diet low in added sugar and fat, moderate in fiber and high in protein  Continue to try new / different foods and to make "healthier" recipes at home, great job!  Bone broth or bone-in meats: sources of collagen  If buying red meat, choose lean cuts ... Cook / pour / skim the fat off when cooking ... Or choose 93/7 or leaner for ground meats  Recommended dosages for Vitamins when at the store (if not taking a Multivitamin):  Vitamin D3: 1000IU- 2000IU/ day  Vitamin K: 8mcg/ day .Marland Kitchen Either 1 or 2 is fine  Vitamin A: 700 RAE/ day  Vitamin E: 15mg / day  Vitamin B12: 1.8 mcg/ day

## 2017-08-24 ENCOUNTER — Ambulatory Visit: Payer: Medicare HMO | Admitting: Dietician

## 2019-02-01 ENCOUNTER — Other Ambulatory Visit: Payer: Self-pay | Admitting: Family Medicine

## 2019-02-01 DIAGNOSIS — Z1231 Encounter for screening mammogram for malignant neoplasm of breast: Secondary | ICD-10-CM

## 2019-03-14 ENCOUNTER — Ambulatory Visit
Admission: RE | Admit: 2019-03-14 | Discharge: 2019-03-14 | Disposition: A | Discharge status: Home / Self Care | Payer: Medicare HMO | Source: Ambulatory Visit | Attending: Family Medicine | Admitting: Family Medicine

## 2019-03-14 DIAGNOSIS — Z1231 Encounter for screening mammogram for malignant neoplasm of breast: Secondary | ICD-10-CM

## 2019-04-19 ENCOUNTER — Emergency Department: Payer: Medicare HMO

## 2019-04-19 ENCOUNTER — Emergency Department
Admission: EM | Admit: 2019-04-19 | Discharge: 2019-04-19 | Disposition: A | Discharge status: Home / Self Care | Payer: Medicare HMO | Attending: Emergency Medicine | Admitting: Emergency Medicine

## 2019-04-19 ENCOUNTER — Other Ambulatory Visit: Payer: Self-pay

## 2019-04-19 DIAGNOSIS — F17210 Nicotine dependence, cigarettes, uncomplicated: Secondary | ICD-10-CM | POA: Diagnosis not present

## 2019-04-19 DIAGNOSIS — R109 Unspecified abdominal pain: Secondary | ICD-10-CM | POA: Diagnosis not present

## 2019-04-19 DIAGNOSIS — R197 Diarrhea, unspecified: Secondary | ICD-10-CM | POA: Insufficient documentation

## 2019-04-19 DIAGNOSIS — Z8542 Personal history of malignant neoplasm of other parts of uterus: Secondary | ICD-10-CM | POA: Diagnosis not present

## 2019-04-19 DIAGNOSIS — Z79899 Other long term (current) drug therapy: Secondary | ICD-10-CM | POA: Diagnosis not present

## 2019-04-19 LAB — LIPASE, BLOOD: Lipase: 24 U/L (ref 11–51)

## 2019-04-19 LAB — URINALYSIS, COMPLETE (UACMP) WITH MICROSCOPIC
Bacteria, UA: NONE SEEN
Bilirubin Urine: NEGATIVE
Glucose, UA: NEGATIVE mg/dL
Ketones, ur: NEGATIVE mg/dL
Leukocytes,Ua: NEGATIVE
Nitrite: NEGATIVE
Protein, ur: NEGATIVE mg/dL
Specific Gravity, Urine: 1.004 — ABNORMAL LOW (ref 1.005–1.030)
pH: 5 (ref 5.0–8.0)

## 2019-04-19 LAB — COMPREHENSIVE METABOLIC PANEL
ALT: 18 U/L (ref 0–44)
AST: 27 U/L (ref 15–41)
Albumin: 4.3 g/dL (ref 3.5–5.0)
Alkaline Phosphatase: 60 U/L (ref 38–126)
Anion gap: 9 (ref 5–15)
BUN: 19 mg/dL (ref 8–23)
CO2: 25 mmol/L (ref 22–32)
Calcium: 9.8 mg/dL (ref 8.9–10.3)
Chloride: 106 mmol/L (ref 98–111)
Creatinine, Ser: 0.94 mg/dL (ref 0.44–1.00)
GFR calc Af Amer: >60 mL/min (ref >60)
GFR calc non Af Amer: >60 mL/min (ref >60)
Glucose, Bld: 90 mg/dL (ref 70–99)
Potassium: 3.8 mmol/L (ref 3.5–5.1)
Sodium: 140 mmol/L (ref 135–145)
Total Bilirubin: 1 mg/dL (ref 0.3–1.2)
Total Protein: 7.7 g/dL (ref 6.5–8.1)

## 2019-04-19 LAB — CBC
HCT: 42 % (ref 36.0–46.0)
Hemoglobin: 14.1 g/dL (ref 12.0–15.0)
MCH: 29.9 pg (ref 26.0–34.0)
MCHC: 33.6 g/dL (ref 30.0–36.0)
MCV: 89.2 fL (ref 80.0–100.0)
Platelets: 252 10*3/uL (ref 150–400)
RBC: 4.71 MIL/uL (ref 3.87–5.11)
RDW: 13.2 % (ref 11.5–15.5)
WBC: 7.9 10*3/uL (ref 4.0–10.5)
nRBC: 0 % (ref 0.0–0.2)

## 2019-04-19 MED ORDER — SODIUM CHLORIDE 0.9 % IV BOLUS: 500.0000 mL | Freq: Once | INTRAVENOUS | Status: DC

## 2019-04-19 NOTE — ED Provider Notes (Signed)
[facility] Emergency Department Provider Note  ____________________________________________   First MD Initiated Contact with Patient 04/19/19 1551     (approximate)  I have reviewed the triage vital signs and the nursing notes.   HISTORY  Chief Complaint Diarrhea and Abdominal Pain    HPI Christina Bullock is a 63 y.o. female presents emergency department complaining of diarrhea for the last few days.  Abdominal pain for approximately 1 to 2 months.  States that she feels like something is cutting her off right in the middle of her stomach.  States that she has had a lot of surgeries on her abdomen in which she ended up with a colostomy and ileostomy bag.  States that the bowel ruptured.  She is afraid this could be similar to that.  Her physician wanted her to have a CT but has not been able to get her in for 1.  She states the stool is very liquidy.  Every time she eats liquid pours back out.  She denies fever or chills.  She denies any exposure to Covid.    Past Medical History:  Diagnosis Date  . Anxiety   . Cancer (HCC)    endometrial  . Depression   . Mitral prolapse   . Thyroid disease     There are no problems to display for this patient.   Past Surgical History:  Procedure Laterality Date  . BLADDER REMOVAL    . COLON SURGERY    . COLOSTOMY    . COLOSTOMY REVERSAL    . ILEOSTOMY    . THYROID SURGERY      Prior to Admission medications   Medication Sig Start Date End Date Taking? Authorizing Provider  cholecalciferol (VITAMIN D) 1000 units tablet Take 1,000 Units by mouth daily.    [provider]  Cyanocobalamin (VITAMIN B-12 IJ) Inject as directed.    [provider]  levothyroxine (SYNTHROID, LEVOTHROID) 75 MCG tablet Take 75 mcg by mouth daily before breakfast.    [provider]    Allergies Iohexol, Phenylephrine, Albuterol, Albuterol-ipratropium-soybean lecithin [ipratropium-albuterol],  Benadryl [diphenhydramine], Codeine, Contrast media [iodinated diagnostic agents], Effexor [venlafaxine], Erythromycin, Kenalog [triamcinolone], Nitrofurantoin macrocrystal, Other, Sulfa antibiotics, Sulfasalazine, Butorphanol tartrate [butorphanol], Ciprofloxacin, Demerol [meperidine], and Floxin [ofloxacin]  Family History  Adopted: Yes  Problem Relation Age of Onset  . Breast cancer Neg Hx     Social History Social History   Tobacco Use  . Smoking status: Current Every Day Smoker    Packs/day: 1.00    Types: Cigarettes  . Smokeless tobacco: Never Used  Substance Use Topics  . Alcohol use: No  . Drug use: No    Review of Systems  Constitutional: No fever/chills Eyes: No visual changes. ENT: No sore throat. Respiratory: Denies cough Gastrointestinal: Positive for diarrhea and abdominal pain Genitourinary: Negative for dysuria. Musculoskeletal: Negative for back pain. Skin: Negative for rash.    ____________________________________________   PHYSICAL EXAM:  VITAL SIGNS: ED Triage Vitals  Enc Vitals Group     BP 04/19/19 1339 (!) 136/97     Pulse Rate 04/19/19 1339 75     Resp 04/19/19 1339 18     Temp 04/19/19 1339 99 F (37.2 C)     Temp Source 04/19/19 1339 Oral     SpO2 04/19/19 1339 97 %     Weight 04/19/19 1340 190 lb (86.2 kg)     Height 04/19/19 1340 5\' 6"  (1.676 m)     Head Circumference --  Peak Flow --      Pain Score 04/19/19 1339 5     Pain Loc --      Pain Edu? --      Excl. in [place]? --     Constitutional: Alert and oriented. Well appearing and in no acute distress. Eyes: Conjunctivae are normal.  Head: Atraumatic. Nose: No congestion/rhinnorhea. Mouth/Throat: Mucous membranes are moist.   Neck:  supple no lymphadenopathy noted Cardiovascular: Normal rate, regular rhythm. Heart sounds are normal Respiratory: Normal respiratory effort.  No retractions, lungs c t a  Abd: soft mildly tender tender bs increased in the right upper quadrant,  normal in the other 3 quadrants GU: deferred Musculoskeletal: FROM all extremities, warm and well perfused Neurologic:  Normal speech and language.  Skin:  Skin is warm, dry and intact. No rash noted. Psychiatric: Mood and affect are normal. Speech and behavior are normal.  ____________________________________________   LABS (all labs ordered are listed, but only abnormal results are displayed)  Labs Reviewed  URINALYSIS, COMPLETE (UACMP) WITH MICROSCOPIC - Abnormal; Notable for the following components:      Result Value   Color, Urine STRAW (*)    APPearance CLEAR (*)    Specific Gravity, Urine 1.004 (*)    Hgb urine dipstick SMALL (*)    All other components within normal limits  GI PATHOGEN PANEL BY PCR, STOOL  LIPASE, BLOOD  COMPREHENSIVE METABOLIC PANEL  CBC   ____________________________________________   ____________________________________________  RADIOLOGY  CT abdomen/pelvis.  I would not order IV contrast this patient has a contrast allergy  ____________________________________________   PROCEDURES  Procedure(s) performed: Saline lock, normal saline 500 mg IV   Procedures    ____________________________________________   INITIAL IMPRESSION / ASSESSMENT AND PLAN / ED COURSE  Pertinent labs & imaging results that were available during my care of the patient were reviewed by me and considered in my medical decision making (see chart for details).   Patient 63 year old female with a complex GI history.  Presenting today with watery diarrhea for several days.  Abdominal pain for 1 to 2 months.  States it is worsening since the diarrhea started.  History of a bowel rupture.  Physical exam shows patient to appear stable.  Abdomen is tender with hyperactive bowel sounds in the right upper quadrant only.  CBC is normal, lipase is normal, comprehensive metabolic panel is normal  I ordered a stool sample for a GI pathogen, saline lock, normal saline 1 L IV  and a CT abdomen/pelvis.  This will be done without contrast as the patient has a contrast allergy.  DDx: Gastroenteritis, diarrhea of infectious origin, bowel obstruction   CT is negative for any acute abnormality.  Explained the findings to the patient.  We still need stool samples.  ----------------------------------------- 6:50 PM on 04/19/2019 -----------------------------------------  Patient is requesting to leave.  I would still like to obtain stool samples however the patient is ready to go home.  She could do this by following up with her regular doctor.  If she is worsening she is to return emergency department.  She could try over-the-counter Imodium A-D to see if this helps with her diarrhea.  She was discharged stable condition.  Christina Bullock was evaluated in Emergency Department on 04/19/2019 for the symptoms described in the history of present illness. She was evaluated in the context of the global COVID-19 pandemic, which necessitated consideration that the patient might be at risk for infection with the SARS-CoV-2 virus that causes COVID-19.  Institutional protocols and algorithms that pertain to the evaluation of patients at risk for COVID-19 are in a state of rapid change based on information released by regulatory bodies including the CDC and federal and state organizations. These policies and algorithms were followed during the patient's care in the ED.   As part of my medical decision making, I reviewed the following data within the [place] notes reviewed and incorporated, Labs reviewed , Old chart reviewed, Radiograph reviewed , Notes from prior ED visits and [place] Controlled Substance Database  ____________________________________________   FINAL CLINICAL IMPRESSION(S) / ED DIAGNOSES  Final diagnoses:  Diarrhea, unspecified type      NEW MEDICATIONS STARTED DURING THIS VISIT:  New Prescriptions   No medications on file      Note:  This document was prepared using Dragon voice recognition software and may include unintentional dictation errors.    Versie Starks, PA-C 04/19/19 1851    Earleen Newport, MD 04/19/19 Einar Crow

## 2019-04-19 NOTE — Discharge Instructions (Addendum)
Follow-up with your regular doctor or your regular GI doctor.  Return emergency department worsening.  Your CT today is negative.  I would still recommend that you obtain stool samples.  This would help [place] determine exactly why you have the diarrhea.  Take over-the-counter Imodium AD.

## 2019-04-19 NOTE — ED Triage Notes (Signed)
Reports she was sent by her PCP due to worsening abdominal pain, diarrhea for last few days, has been a problem for few weeks. Reports pain to upper and lower bilaterally, spasms.   Pt alert and oriented X4, cooperative, RR even and unlabored, color WNL. Pt in NAD.

## 2019-05-15 ENCOUNTER — Telehealth: Payer: Self-pay | Admitting: *Deleted

## 2019-05-15 DIAGNOSIS — Z87891 Personal history of nicotine dependence: Secondary | ICD-10-CM

## 2019-05-15 NOTE — Telephone Encounter (Signed)
Received referral for initial lung cancer screening scan. Contacted patient and obtained smoking history,(current, 50 pack year) as well as answering questions related to screening process. Patient denies signs of lung cancer such as weight loss or hemoptysis. Patient denies comorbidity that would prevent curative treatment if lung cancer were found. Patient is scheduled for shared decision making visit and CT scan on 05/23/19 at 1015am.

## 2019-05-23 ENCOUNTER — Inpatient Hospital Stay: Payer: Medicare HMO | Attending: Oncology | Admitting: Oncology

## 2019-05-23 ENCOUNTER — Encounter: Payer: Self-pay | Admitting: Oncology

## 2019-05-23 ENCOUNTER — Ambulatory Visit
Admission: RE | Admit: 2019-05-23 | Discharge: 2019-05-23 | Disposition: A | Discharge status: Home / Self Care | Payer: Medicare HMO | Source: Ambulatory Visit | Attending: Oncology | Admitting: Oncology

## 2019-05-23 ENCOUNTER — Other Ambulatory Visit: Payer: Self-pay

## 2019-05-23 DIAGNOSIS — Z87891 Personal history of nicotine dependence: Secondary | ICD-10-CM

## 2019-05-23 NOTE — Progress Notes (Signed)
Virtual Visit via Video Note  I connected with Christina Bullock on 05/23/19 at 10:15 AM EST by a video enabled telemedicine application and verified that I am speaking with the correct person using two identifiers.  Location: Patient: [facility] Provider: Office   I discussed the limitations of evaluation and management by telemedicine and the availability of in person appointments. The patient expressed understanding and agreed to proceed.  I discussed the assessment and treatment plan with the patient. The patient was provided an opportunity to ask questions and all were answered. The patient agreed with the plan and demonstrated an understanding of the instructions.   The patient was advised to call back or seek an in-person evaluation if the symptoms worsen or if the condition fails to improve as anticipated.   In accordance with CMS guidelines, patient has met eligibility criteria including age, absence of signs or symptoms of lung cancer.  Social History   Tobacco Use  . Smoking status: Current Every Day Smoker    Packs/day: 1.00    Years: 50.00    Pack years: 50.00    Types: Cigarettes  . Smokeless tobacco: Never Used  Substance Use Topics  . Alcohol use: No  . Drug use: No      A shared decision-making session was conducted prior to the performance of CT scan. This includes one or more decision aids, includes benefits and harms of screening, follow-up diagnostic testing, over-diagnosis, false positive rate, and total radiation exposure.   Counseling on the importance of adherence to annual lung cancer LDCT screening, impact of co-morbidities, and ability or willingness to undergo diagnosis and treatment is imperative for compliance of the program.   Counseling on the importance of continued smoking cessation for former smokers; the importance of smoking cessation for current smokers, and information about tobacco cessation interventions have been given to patient including [place] and [employer] quit [place] programs.   Written order for lung cancer screening with LDCT has been given to the patient and any and all questions have been answered to the best of my abilities.    Yearly follow up will be coordinated by Burgess Estelle, Thoracic Navigator.  I provided 15 minutes of face-to-face video visit time during this encounter, and > 50% was spent counseling as documented under my assessment & plan.   Jacquelin Hawking, NP

## 2019-05-25 ENCOUNTER — Encounter: Payer: Self-pay | Admitting: *Deleted

## 2020-05-14 ENCOUNTER — Other Ambulatory Visit: Payer: Self-pay | Admitting: *Deleted

## 2020-05-14 DIAGNOSIS — Z87891 Personal history of nicotine dependence: Secondary | ICD-10-CM

## 2020-05-14 DIAGNOSIS — Z122 Encounter for screening for malignant neoplasm of respiratory organs: Secondary | ICD-10-CM

## 2020-05-14 NOTE — Progress Notes (Signed)
Contacted and scheduled for annual lung screening ct scan. Patient is a current smoker with a 50.5 pack year history.

## 2020-05-22 ENCOUNTER — Other Ambulatory Visit: Payer: Self-pay

## 2020-05-22 ENCOUNTER — Ambulatory Visit
Admission: RE | Admit: 2020-05-22 | Discharge: 2020-05-22 | Disposition: A | Discharge status: Home / Self Care | Payer: Medicare HMO | Source: Ambulatory Visit | Attending: Nurse Practitioner | Admitting: Nurse Practitioner

## 2020-05-22 DIAGNOSIS — Z122 Encounter for screening for malignant neoplasm of respiratory organs: Secondary | ICD-10-CM | POA: Insufficient documentation

## 2020-05-22 DIAGNOSIS — Z87891 Personal history of nicotine dependence: Secondary | ICD-10-CM | POA: Diagnosis not present

## 2020-05-28 ENCOUNTER — Encounter: Payer: Self-pay | Admitting: *Deleted

## 2020-06-06 ENCOUNTER — Other Ambulatory Visit
Admission: RE | Admit: 2020-06-06 | Discharge: 2020-06-06 | Disposition: A | Discharge status: Home / Self Care | Payer: Medicare HMO | Source: Ambulatory Visit | Attending: Gastroenterology | Admitting: Gastroenterology

## 2020-06-06 DIAGNOSIS — Z20822 Contact with and (suspected) exposure to covid-19: Secondary | ICD-10-CM | POA: Insufficient documentation

## 2020-06-06 DIAGNOSIS — Z01812 Encounter for preprocedural laboratory examination: Secondary | ICD-10-CM | POA: Insufficient documentation

## 2020-06-06 LAB — SARS CORONAVIRUS 2 (TAT 6-24 HRS): SARS Coronavirus 2: NEGATIVE

## 2020-06-10 ENCOUNTER — Encounter: Payer: Self-pay | Admitting: *Deleted

## 2020-06-10 ENCOUNTER — Ambulatory Visit: Payer: Medicare HMO | Admitting: Anesthesiology

## 2020-06-10 ENCOUNTER — Ambulatory Visit
Admission: RE | Admit: 2020-06-10 | Discharge: 2020-06-10 | Disposition: A | Discharge status: Home / Self Care | Payer: Medicare HMO | Attending: Gastroenterology | Admitting: Gastroenterology

## 2020-06-10 ENCOUNTER — Other Ambulatory Visit: Payer: Self-pay

## 2020-06-10 ENCOUNTER — Encounter
Admission: RE | Disposition: A | Discharge status: Home / Self Care | Payer: Self-pay | Source: Home / Self Care | Attending: Gastroenterology

## 2020-06-10 DIAGNOSIS — Z888 Allergy status to other drugs, medicaments and biological substances status: Secondary | ICD-10-CM | POA: Diagnosis not present

## 2020-06-10 DIAGNOSIS — Z881 Allergy status to other antibiotic agents status: Secondary | ICD-10-CM | POA: Diagnosis not present

## 2020-06-10 DIAGNOSIS — R197 Diarrhea, unspecified: Secondary | ICD-10-CM | POA: Diagnosis present

## 2020-06-10 DIAGNOSIS — Z79890 Hormone replacement therapy (postmenopausal): Secondary | ICD-10-CM | POA: Insufficient documentation

## 2020-06-10 DIAGNOSIS — Z885 Allergy status to narcotic agent status: Secondary | ICD-10-CM | POA: Insufficient documentation

## 2020-06-10 DIAGNOSIS — K640 First degree hemorrhoids: Secondary | ICD-10-CM | POA: Insufficient documentation

## 2020-06-10 DIAGNOSIS — K3189 Other diseases of stomach and duodenum: Secondary | ICD-10-CM | POA: Insufficient documentation

## 2020-06-10 DIAGNOSIS — Z882 Allergy status to sulfonamides status: Secondary | ICD-10-CM | POA: Insufficient documentation

## 2020-06-10 DIAGNOSIS — K219 Gastro-esophageal reflux disease without esophagitis: Secondary | ICD-10-CM | POA: Diagnosis present

## 2020-06-10 DIAGNOSIS — K2100 Gastro-esophageal reflux disease with esophagitis, without bleeding: Secondary | ICD-10-CM | POA: Insufficient documentation

## 2020-06-10 DIAGNOSIS — Z79899 Other long term (current) drug therapy: Secondary | ICD-10-CM | POA: Diagnosis not present

## 2020-06-10 HISTORY — DX: Gastritis, unspecified, without bleeding: K29.70

## 2020-06-10 HISTORY — PX: ESOPHAGOGASTRODUODENOSCOPY (EGD) WITH PROPOFOL: SHX5813

## 2020-06-10 HISTORY — DX: Emphysema, unspecified: J43.9

## 2020-06-10 HISTORY — PX: COLONOSCOPY WITH PROPOFOL: SHX5780

## 2020-06-10 HISTORY — DX: Hypothyroidism, unspecified: E03.9

## 2020-06-10 SURGERY — COLONOSCOPY WITH PROPOFOL
Anesthesia: General

## 2020-06-10 MED ORDER — LIDOCAINE HCL (CARDIAC) PF 100 MG/5ML IV SOSY
PREFILLED_SYRINGE | INTRAVENOUS | Status: DC | PRN
  Administered 2020-06-10: 100 mg via INTRAVENOUS

## 2020-06-10 MED ORDER — PROPOFOL 10 MG/ML IV BOLUS
INTRAVENOUS | Status: DC | PRN
  Administered 2020-06-10: 20 mg via INTRAVENOUS
  Administered 2020-06-10: 30 mg via INTRAVENOUS
  Administered 2020-06-10: 100 mg via INTRAVENOUS

## 2020-06-10 MED ORDER — SODIUM CHLORIDE 0.9 % IV SOLN: INTRAVENOUS | Status: DC

## 2020-06-10 MED ORDER — PROPOFOL 500 MG/50ML IV EMUL
INTRAVENOUS | Status: DC | PRN
  Administered 2020-06-10: 120 ug/kg/min via INTRAVENOUS

## 2020-06-10 NOTE — H&P (Signed)
Outpatient short stay form Pre-procedure 06/10/2020 9:13 AM Christina Miyamoto MD, MPH  Primary Physician: Gale Journey FNP  Reason for visit:  GERD/Diarrhea  History of present illness:   65 y/o lady with complicated surgical history here for EGD for GERD and colonoscopy for diarrhea/screening. No blood thinners or family history of GI malignancies. Overall doing ok.    Current Facility-Administered Medications:  .  0.9 %  sodium chloride infusion, , Intravenous, Continuous, Chrysten Woulfe, Hilton Cork, MD  Medications Prior to Admission  Medication Sig Dispense Refill Last Dose  . cholecalciferol (VITAMIN D) 1000 units tablet Take 1,000 Units by mouth daily.   06/10/2020 at 0800  . Cyanocobalamin (VITAMIN B-12 IJ) Inject as directed.   06/10/2020 at 0800  . levothyroxine (SYNTHROID, LEVOTHROID) 75 MCG tablet Take 75 mcg by mouth daily before breakfast.   06/10/2020 at 0800  . busPIRone (BUSPAR) 5 MG tablet Take 5 mg by mouth daily. (Patient not taking: Reported on 06/10/2020)   Not Taking at Unknown time  . pantoprazole (PROTONIX) 20 MG tablet Take 20 mg by mouth daily. (Patient not taking: Reported on 06/10/2020)   Not Taking at Unknown time     Allergies  Allergen Reactions  . Iohexol Anaphylaxis  . Phenylephrine Anaphylaxis    Heart races  . Albuterol   . Albuterol-Ipratropium-Soybean Lecithin [Ipratropium-Albuterol] Other (See Comments)    Heart racing  . Benadryl [Diphenhydramine] Other (See Comments)    Heart racing  . Codeine Nausea Only  . Contrast Media [Iodinated Diagnostic Agents]   . Effexor [Venlafaxine] Other (See Comments)    Slows her down  . Erythromycin Nausea Only  . Kenalog [Triamcinolone]   . Nitrofurantoin Macrocrystal   . Other     aectylpropalomine  . Sulfa Antibiotics Other (See Comments)    headache  . Sulfasalazine   . Ciprofloxacin Anxiety  . Demerol [Meperidine] Anxiety  . Floxin [Ofloxacin] Anxiety  . Stadol [Butorphanol] Anxiety     Past Medical History:   Diagnosis Date  . Anxiety   . Cancer (HCC)    endometrial  . Depression   . Gastritis   . Hypothyroidism   . Mitral prolapse   . Pulmonary emphysema ([place])   . Thyroid disease     Review of systems:  Otherwise negative.    Physical Exam  Gen: Alert, oriented. Appears stated age.  HEENT: PERRLA. Lungs: No respiratory distress CV: RRR Abd: soft, benign, no masses Ext: No edema    Planned procedures: Proceed with EGD/colonoscopy. The patient understands the nature of the planned procedure, indications, risks, alternatives and potential complications including but not limited to bleeding, infection, perforation, damage to internal organs and possible oversedation/side effects from anesthesia. The patient agrees and gives consent to proceed.  Please refer to procedure notes for findings, recommendations and patient disposition/instructions.     Christina Miyamoto MD, MPH Gastroenterology 06/10/2020  9:13 AM

## 2020-06-10 NOTE — Anesthesia Preprocedure Evaluation (Addendum)
Anesthesia Evaluation  Patient identified by MRN, date of birth, ID band Patient awake    Reviewed: Allergy & Precautions, H&P , NPO status , Patient's Chart, lab work & pertinent test results  History of Anesthesia Complications (+) Emergence Delirium and history of anesthetic complications  Airway Mallampati: III  TM Distance: <3 FB Neck ROM: limited    Dental  (+) Chipped, Poor Dentition, Missing   Pulmonary neg shortness of breath, COPD, Current Smoker and Patient abstained from smoking.,    Pulmonary exam normal        Cardiovascular Exercise Tolerance: Good (-) angina(-) Past MI and (-) DOE negative cardio ROS Normal cardiovascular exam     Neuro/Psych PSYCHIATRIC DISORDERS negative neurological ROS     GI/Hepatic negative GI ROS, Neg liver ROS, neg GERD  ,  Endo/Other  Hypothyroidism   Renal/GU negative Renal ROS  negative genitourinary   Musculoskeletal   Abdominal   Peds  Hematology negative hematology ROS (+)   Anesthesia Other Findings Past Medical History: No date: Anxiety No date: Cancer [facility])     Comment:  endometrial No date: Depression No date: Gastritis No date: Hypothyroidism No date: Mitral prolapse No date: Pulmonary emphysema (HCC) No date: Thyroid disease  Past Surgical History: No date: BLADDER REMOVAL No date: COLON SURGERY No date: COLONOSCOPY No date: COLOSTOMY No date: COLOSTOMY REVERSAL No date: ILEOSTOMY No date: THYROID SURGERY No date: UPPER GI ENDOSCOPY  BMI    Body Mass Index: 29.05 kg/m      Reproductive/Obstetrics negative OB ROS                            Anesthesia Physical Anesthesia Plan  ASA: III  Anesthesia Plan: General   Post-op Pain Management:    Induction: Intravenous  PONV Risk Score and Plan: Propofol infusion and TIVA  Airway Management Planned: Natural Airway and Nasal Cannula  Additional Equipment:    Intra-op Plan:   Post-operative Plan:   Informed Consent: I have reviewed the patients History and Physical, chart, labs and discussed the procedure including the risks, benefits and alternatives for the proposed anesthesia with the patient or authorized representative who has indicated his/her understanding and acceptance.     Dental Advisory Given  Plan Discussed with: Anesthesiologist, CRNA and Surgeon  Anesthesia Plan Comments: (Patient consented for risks of anesthesia including but not limited to:  - adverse reactions to medications - risk of airway placement if required - damage to eyes, teeth, lips or other oral mucosa - nerve damage due to positioning  - sore throat or hoarseness - Damage to heart, brain, nerves, lungs, other parts of body or loss of life  Patient voiced understanding.)        Anesthesia Quick Evaluation

## 2020-06-10 NOTE — Anesthesia Postprocedure Evaluation (Signed)
Anesthesia Post Note  Patient: Christina Bullock  Procedure(s) Performed: COLONOSCOPY WITH PROPOFOL (N/A ) ESOPHAGOGASTRODUODENOSCOPY (EGD) WITH PROPOFOL (N/A )  Patient location during evaluation: Endoscopy Anesthesia Type: General Level of consciousness: awake and alert Pain management: pain level controlled Vital Signs Assessment: post-procedure vital signs reviewed and stable Respiratory status: spontaneous breathing, nonlabored ventilation, respiratory function stable and patient connected to nasal cannula oxygen Cardiovascular status: blood pressure returned to baseline and stable Postop Assessment: no apparent nausea or vomiting Anesthetic complications: no   No complications documented.   Last Vitals:  Vitals:   06/10/20 0948 06/10/20 1008  BP: 125/64 (!) 142/127  Pulse: 72   Resp: 12   Temp: (!) 36.3 C   SpO2: 97%     Last Pain:  Vitals:   06/10/20 1018  TempSrc:   PainSc: 0-No pain                 Precious Haws [place]iscitello

## 2020-06-10 NOTE — Op Note (Signed)
[facility]enter[facility] Patient Name: Christina Bullock Procedure Date: 06/10/2020 9:19 AM MRN: [id number] Account #: [id number] Date of Birth: [birth year] Admit Type: Outpatient Age: 65 Room: [facility] ENDO ROOM 1 Gender: Female Note Status: Finalized Procedure:             Colonoscopy Indications:           Diarrhea Providers:             Andrey Farmer MD, MD Referring MD:          No Local Md, MD (Referring MD) Medicines:             Monitored Anesthesia Care Complications:         No immediate complications. Estimated blood loss:                         Minimal. Procedure:             Pre-Anesthesia Assessment:                        - Prior to the procedure, a History and Physical was                         performed, and patient medications and allergies were                         reviewed. The patient is competent. The risks and                         benefits of the procedure and the sedation options and                         risks were discussed with the patient. All questions                         were answered and informed consent was obtained.                         Patient identification and proposed procedure were                         verified by the physician, the nurse, the anesthetist                         and the technician in the endoscopy suite. Mental                         Status Examination: alert and oriented. Airway                         Examination: normal oropharyngeal airway and neck                         mobility. Respiratory Examination: clear to                         auscultation. CV Examination: normal. Prophylactic                         Antibiotics: The patient  does not require prophylactic                         antibiotics. Prior Anticoagulants: The patient has                         taken no previous anticoagulant or antiplatelet                         agents. ASA Grade Assessment: II - A patient  with mild                         systemic disease. After reviewing the risks and                         benefits, the patient was deemed in satisfactory                         condition to undergo the procedure. The anesthesia                         plan was to use monitored anesthesia care (MAC).                         Immediately prior to administration of medications,                         the patient was re-assessed for adequacy to receive                         sedatives. The heart rate, respiratory rate, oxygen                         saturations, blood pressure, adequacy of pulmonary                         ventilation, and response to care were monitored                         throughout the procedure. The physical status of the                         patient was re-assessed after the procedure.                        After obtaining informed consent, the colonoscope was                         passed under direct vision. Throughout the procedure,                         the patient's blood pressure, pulse, and oxygen                         saturations were monitored continuously. The                         Colonoscope was introduced through the anus and  advanced to the the terminal ileum. The colonoscopy                         was performed without difficulty. The patient                         tolerated the procedure well. The quality of the bowel                         preparation was inadequate. Findings:      The perianal and digital rectal examinations were normal.      The terminal ileum appeared normal.      A moderate amount of stool was found in the recto-sigmoid colon, at the       hepatic flexure, in the ascending colon and in the cecum, precluding       visualization.      Normal mucosa was found in the entire colon. Biopsies for histology were       taken with a cold forceps from the entire colon for evaluation of        microscopic colitis. Estimated blood loss was minimal.      Internal hemorrhoids were found during retroflexion. The hemorrhoids       were Grade I (internal hemorrhoids that do not prolapse).      The exam was otherwise without abnormality on direct and retroflexion       views. Impression:            - Preparation of the colon was inadequate.                        - The examined portion of the ileum was normal.                        - Stool in the recto-sigmoid colon, at the hepatic                         flexure, in the ascending colon and in the cecum.                        - Normal mucosa in the entire examined colon. Biopsied.                        - Internal hemorrhoids.                        - The examination was otherwise normal on direct and                         retroflexion views. Recommendation:        - Discharge patient to home.                        - Resume previous diet.                        - Continue present medications.                        - Await pathology results.                        -  Repeat colonoscopy for screening when agreeable to                         patient because the bowel preparation was suboptimal.                        - Return to referring physician as previously                         scheduled. Procedure Code(s):     --- Professional ---                        [phone], Colonoscopy, flexible; with biopsy, single or                         multiple Diagnosis Code(s):     --- Professional ---                        K64.0, First degree hemorrhoids                        R19.7, Diarrhea, unspecified CPT copyright 2019 American Medical Association. All rights reserved. The codes documented in this report are preliminary and upon coder review may  be revised to meet current compliance requirements. Andrey Farmer MD, MD 06/10/2020 10:08:46 AM Number of Addenda: 0 Note Initiated On: 06/10/2020 9:19 AM Scope Withdrawal Time: 0 hours 4  minutes 46 seconds  Total Procedure Duration: 0 hours 6 minutes 19 seconds  Estimated Blood Loss:  Estimated blood loss was minimal.      [facility]

## 2020-06-10 NOTE — Op Note (Signed)
[facility] Gastroenterology Patient Name: Artemisa Sladek Procedure Date: 06/10/2020 9:20 AM MRN: [id number] Account #: [id number] Date of Birth: 1955-06-23 Admit Type: Outpatient Age: 65 Room: [facility] ENDO ROOM 1 Gender: Female Note Status: Finalized Procedure:             Upper GI endoscopy Indications:           Gastro-esophageal reflux disease Providers:             Andrey Farmer MD, MD Referring MD:          No Local Md, MD (Referring MD) Medicines:             Monitored Anesthesia Care Complications:         No immediate complications. Estimated blood loss:                         Minimal. Procedure:             Pre-Anesthesia Assessment:                        - Prior to the procedure, a History and Physical was                         performed, and patient medications and allergies were                         reviewed. The patient is competent. The risks and                         benefits of the procedure and the sedation options and                         risks were discussed with the patient. All questions                         were answered and informed consent was obtained.                         Patient identification and proposed procedure were                         verified by the physician, the nurse, the anesthetist                         and the technician in the endoscopy suite. Mental                         Status Examination: alert and oriented. Airway                         Examination: normal oropharyngeal airway and neck                         mobility. Respiratory Examination: clear to                         auscultation. CV Examination: normal. Prophylactic  Antibiotics: The patient does not require prophylactic                         antibiotics. Prior Anticoagulants: The patient has                         taken no previous anticoagulant or antiplatelet                         agents. ASA  Grade Assessment: II - A patient with mild                         systemic disease. After reviewing the risks and                         benefits, the patient was deemed in satisfactory                         condition to undergo the procedure. The anesthesia                         plan was to use monitored anesthesia care (MAC).                         Immediately prior to administration of medications,                         the patient was re-assessed for adequacy to receive                         sedatives. The heart rate, respiratory rate, oxygen                         saturations, blood pressure, adequacy of pulmonary                         ventilation, and response to care were monitored                         throughout the procedure. The physical status of the                         patient was re-assessed after the procedure.                        After obtaining informed consent, the endoscope was                         passed under direct vision. Throughout the procedure,                         the patient's blood pressure, pulse, and oxygen                         saturations were monitored continuously. The Endoscope                         was introduced through the mouth, and advanced to the  second part of duodenum. The upper GI endoscopy was                         accomplished without difficulty. The patient tolerated                         the procedure well. Findings:      The examined esophagus was normal. Biopsies were obtained from the       proximal and distal esophagus with cold forceps for histology of       suspected eosinophilic esophagitis. Estimated blood loss was minimal.      A scar was found on the greater curvature of the stomach. The scar       tissue was healthy in appearance. Evidence of previous surgery.      Patchy mildly erythematous mucosa with a small amount of heme was found       in the gastric antrum.  Biopsies were taken with a cold forceps for       Helicobacter pylori testing. Estimated blood loss was minimal.      The examined duodenum was normal. Impression:            - Normal esophagus. Biopsied.                        - Scar in the greater curvature of the stomach.                        - Erythematous mucosa in the antrum. Biopsied.                        - Normal examined duodenum. Recommendation:        - Discharge patient to home.                        - Resume previous diet.                        - Continue present medications.                        - Await pathology results.                        - Return to referring physician as previously                         scheduled. Procedure Code(s):     --- Professional ---                        [phone], Esophagogastroduodenoscopy, flexible,                         transoral; with biopsy, single or multiple Diagnosis Code(s):     --- Professional ---                        K31.89, Other diseases of stomach and duodenum                        K21.9, Gastro-esophageal reflux disease without  esophagitis CPT copyright 2019 American Medical Association. All rights reserved. The codes documented in this report are preliminary and upon coder review may  be revised to meet current compliance requirements. Andrey Farmer MD, MD 06/10/2020 9:50:07 AM Number of Addenda: 0 Note Initiated On: 06/10/2020 9:20 AM Estimated Blood Loss:  Estimated blood loss was minimal.      [facility]

## 2020-06-10 NOTE — Interval H&P Note (Signed)
History and Physical Interval Note:  06/10/2020 9:17 AM  Christina Bullock  has presented today for surgery, with the diagnosis of HRT BURN DIARRHEA.  The various methods of treatment have been discussed with the patient and family. After consideration of risks, benefits and other options for treatment, the patient has consented to  Procedure(s): COLONOSCOPY WITH PROPOFOL (N/A) ESOPHAGOGASTRODUODENOSCOPY (EGD) WITH PROPOFOL (N/A) as a surgical intervention.  The patient's history has been reviewed, patient examined, no change in status, stable for surgery.  I have reviewed the patient's chart and labs.  Questions were answered to the patient's satisfaction.     Lesly Rubenstein  Ok to proceed with EGD/Colonoscopy

## 2020-06-10 NOTE — Transfer of Care (Signed)
Immediate Anesthesia Transfer of Care Note  Patient: Darylene Price  Procedure(s) Performed: COLONOSCOPY WITH PROPOFOL (N/A ) ESOPHAGOGASTRODUODENOSCOPY (EGD) WITH PROPOFOL (N/A )  Patient Location: PACU and Endoscopy Unit  Anesthesia Type:General  Level of Consciousness: sedated  Airway & Oxygen Therapy: Patient Spontanous Breathing  Post-op Assessment: Report given to RN and Post -op Vital signs reviewed and stable  Post vital signs: Reviewed and stable  Last Vitals:  Vitals Value Taken Time  BP 125/64 06/10/20 0948  Temp 36.3 C 06/10/20 0948  Pulse 69 06/10/20 0950  Resp 14 06/10/20 0950  SpO2 96 % 06/10/20 0950  Vitals shown include unvalidated device data.  Last Pain:  Vitals:   06/10/20 0948  TempSrc: Temporal  PainSc: 0-No pain         Complications: No complications documented.

## 2020-06-11 ENCOUNTER — Encounter: Payer: Self-pay | Admitting: Gastroenterology

## 2020-06-11 LAB — SURGICAL PATHOLOGY

## 2021-07-10 ENCOUNTER — Telehealth: Payer: Self-pay | Admitting: *Deleted

## 2021-07-10 NOTE — Telephone Encounter (Signed)
LMTC to schedule yearly Lung CA CT Scan. ?

## 2021-07-13 ENCOUNTER — Other Ambulatory Visit: Payer: Self-pay | Admitting: *Deleted

## 2021-07-13 DIAGNOSIS — Z87891 Personal history of nicotine dependence: Secondary | ICD-10-CM

## 2021-07-13 DIAGNOSIS — F17210 Nicotine dependence, cigarettes, uncomplicated: Secondary | ICD-10-CM

## 2021-07-23 ENCOUNTER — Other Ambulatory Visit: Payer: Self-pay

## 2021-07-23 ENCOUNTER — Ambulatory Visit
Admission: RE | Admit: 2021-07-23 | Discharge: 2021-07-23 | Disposition: A | Discharge status: Home / Self Care | Payer: Medicare HMO | Source: Ambulatory Visit | Attending: Acute Care | Admitting: Acute Care

## 2021-07-23 DIAGNOSIS — F17210 Nicotine dependence, cigarettes, uncomplicated: Secondary | ICD-10-CM | POA: Diagnosis present

## 2021-07-23 DIAGNOSIS — Z87891 Personal history of nicotine dependence: Secondary | ICD-10-CM | POA: Diagnosis present

## 2021-07-27 ENCOUNTER — Other Ambulatory Visit: Payer: Self-pay

## 2021-07-27 DIAGNOSIS — Z87891 Personal history of nicotine dependence: Secondary | ICD-10-CM

## 2021-07-27 DIAGNOSIS — F17210 Nicotine dependence, cigarettes, uncomplicated: Secondary | ICD-10-CM

## 2021-09-17 LAB — COLOGUARD: COLOGUARD: POSITIVE — AB

## 2021-09-22 ENCOUNTER — Inpatient Hospital Stay
Admission: EM | Admit: 2021-09-22 | Discharge: 2021-09-29 | DRG: 391 | Disposition: A | Discharge status: Home Health | Payer: Medicare HMO | Attending: Internal Medicine | Admitting: Internal Medicine

## 2021-09-22 ENCOUNTER — Emergency Department: Payer: Medicare HMO

## 2021-09-22 ENCOUNTER — Other Ambulatory Visit: Payer: Self-pay

## 2021-09-22 ENCOUNTER — Encounter: Payer: Self-pay | Admitting: Emergency Medicine

## 2021-09-22 DIAGNOSIS — E039 Hypothyroidism, unspecified: Secondary | ICD-10-CM | POA: Diagnosis present

## 2021-09-22 DIAGNOSIS — M8580 Other specified disorders of bone density and structure, unspecified site: Secondary | ICD-10-CM | POA: Diagnosis present

## 2021-09-22 DIAGNOSIS — E669 Obesity, unspecified: Secondary | ICD-10-CM | POA: Diagnosis present

## 2021-09-22 DIAGNOSIS — Z79899 Other long term (current) drug therapy: Secondary | ICD-10-CM

## 2021-09-22 DIAGNOSIS — R0902 Hypoxemia: Secondary | ICD-10-CM | POA: Diagnosis not present

## 2021-09-22 DIAGNOSIS — E2749 Other adrenocortical insufficiency: Secondary | ICD-10-CM | POA: Diagnosis present

## 2021-09-22 DIAGNOSIS — J90 Pleural effusion, not elsewhere classified: Secondary | ICD-10-CM | POA: Diagnosis not present

## 2021-09-22 DIAGNOSIS — Z79890 Hormone replacement therapy (postmenopausal): Secondary | ICD-10-CM

## 2021-09-22 DIAGNOSIS — K529 Noninfective gastroenteritis and colitis, unspecified: Principal | ICD-10-CM | POA: Diagnosis present

## 2021-09-22 DIAGNOSIS — R112 Nausea with vomiting, unspecified: Secondary | ICD-10-CM | POA: Diagnosis present

## 2021-09-22 DIAGNOSIS — R109 Unspecified abdominal pain: Secondary | ICD-10-CM | POA: Diagnosis present

## 2021-09-22 DIAGNOSIS — R197 Diarrhea, unspecified: Secondary | ICD-10-CM

## 2021-09-22 DIAGNOSIS — J189 Pneumonia, unspecified organism: Secondary | ICD-10-CM | POA: Diagnosis present

## 2021-09-22 DIAGNOSIS — Z888 Allergy status to other drugs, medicaments and biological substances status: Secondary | ICD-10-CM

## 2021-09-22 DIAGNOSIS — R0602 Shortness of breath: Secondary | ICD-10-CM | POA: Diagnosis not present

## 2021-09-22 DIAGNOSIS — J438 Other emphysema: Secondary | ICD-10-CM | POA: Diagnosis present

## 2021-09-22 DIAGNOSIS — Z20822 Contact with and (suspected) exposure to covid-19: Secondary | ICD-10-CM | POA: Diagnosis present

## 2021-09-22 DIAGNOSIS — F17210 Nicotine dependence, cigarettes, uncomplicated: Secondary | ICD-10-CM | POA: Diagnosis present

## 2021-09-22 DIAGNOSIS — Z6829 Body mass index (BMI) 29.0-29.9, adult: Secondary | ICD-10-CM

## 2021-09-22 DIAGNOSIS — E278 Other specified disorders of adrenal gland: Secondary | ICD-10-CM | POA: Diagnosis present

## 2021-09-22 DIAGNOSIS — Z885 Allergy status to narcotic agent status: Secondary | ICD-10-CM

## 2021-09-22 DIAGNOSIS — Z882 Allergy status to sulfonamides status: Secondary | ICD-10-CM

## 2021-09-22 DIAGNOSIS — R609 Edema, unspecified: Secondary | ICD-10-CM | POA: Diagnosis present

## 2021-09-22 DIAGNOSIS — K760 Fatty (change of) liver, not elsewhere classified: Secondary | ICD-10-CM | POA: Diagnosis present

## 2021-09-22 LAB — URINALYSIS, ROUTINE W REFLEX MICROSCOPIC
Bacteria, UA: NONE SEEN
Bilirubin Urine: NEGATIVE
Glucose, UA: NEGATIVE mg/dL
Ketones, ur: NEGATIVE mg/dL
Leukocytes,Ua: NEGATIVE
Nitrite: NEGATIVE
Protein, ur: NEGATIVE mg/dL
Specific Gravity, Urine: 1.017 (ref 1.005–1.030)
pH: 5 (ref 5.0–8.0)

## 2021-09-22 LAB — COMPREHENSIVE METABOLIC PANEL
ALT: 19 U/L (ref 0–44)
AST: 31 U/L (ref 15–41)
Albumin: 4.2 g/dL (ref 3.5–5.0)
Alkaline Phosphatase: 63 U/L (ref 38–126)
Anion gap: 8 (ref 5–15)
BUN: 17 mg/dL (ref 8–23)
CO2: 26 mmol/L (ref 22–32)
Calcium: 9.4 mg/dL (ref 8.9–10.3)
Chloride: 103 mmol/L (ref 98–111)
Creatinine, Ser: 1.14 mg/dL — ABNORMAL HIGH (ref 0.44–1.00)
GFR, Estimated: 53 mL/min — ABNORMAL LOW (ref >60)
Glucose, Bld: 146 mg/dL — ABNORMAL HIGH (ref 70–99)
Potassium: 4.4 mmol/L (ref 3.5–5.1)
Sodium: 137 mmol/L (ref 135–145)
Total Bilirubin: 1 mg/dL (ref 0.3–1.2)
Total Protein: 7.2 g/dL (ref 6.5–8.1)

## 2021-09-22 LAB — CBC
HCT: 45.1 % (ref 36.0–46.0)
Hemoglobin: 14.3 g/dL (ref 12.0–15.0)
MCH: 29.7 pg (ref 26.0–34.0)
MCHC: 31.7 g/dL (ref 30.0–36.0)
MCV: 93.8 fL (ref 80.0–100.0)
Platelets: 219 10*3/uL (ref 150–400)
RBC: 4.81 MIL/uL (ref 3.87–5.11)
RDW: 12.9 % (ref 11.5–15.5)
WBC: 14.6 10*3/uL — ABNORMAL HIGH (ref 4.0–10.5)
nRBC: 0 % (ref 0.0–0.2)

## 2021-09-22 LAB — CORTISOL-AM, BLOOD: Cortisol - AM: 54.7 ug/dL — ABNORMAL HIGH (ref 6.7–22.6)

## 2021-09-22 LAB — LIPASE, BLOOD: Lipase: 28 U/L (ref 11–51)

## 2021-09-22 MED ORDER — SODIUM CHLORIDE 0.9 % IV BOLUS (SEPSIS)
1000.0000 mL | Freq: Once | INTRAVENOUS | Status: AC
  Administered 2021-09-22: 1000 mL via INTRAVENOUS

## 2021-09-22 MED ORDER — ONDANSETRON HCL 4 MG/2ML IJ SOLN
4.0000 mg | Freq: Once | INTRAMUSCULAR | Status: DC
  Filled 2021-09-22: qty 2

## 2021-09-22 MED ORDER — PANTOPRAZOLE SODIUM 40 MG IV SOLR
40.0000 mg | INTRAVENOUS | Status: DC
  Administered 2021-09-22 – 2021-09-27 (×6): 40 mg via INTRAVENOUS
  Filled 2021-09-22 (×6): qty 10

## 2021-09-22 MED ORDER — ENOXAPARIN SODIUM 40 MG/0.4ML IJ SOSY: 40.0000 mg | PREFILLED_SYRINGE | INTRAMUSCULAR | Status: DC

## 2021-09-22 MED ORDER — SODIUM CHLORIDE 0.9% FLUSH
3.0000 mL | Freq: Two times a day (BID) | INTRAVENOUS | Status: DC
  Administered 2021-09-22 – 2021-09-29 (×14): 3 mL via INTRAVENOUS

## 2021-09-22 MED ORDER — SODIUM CHLORIDE 0.9 % IV SOLN: INTRAVENOUS | Status: DC

## 2021-09-22 MED ORDER — LEVOTHYROXINE SODIUM 50 MCG PO TABS
50.0000 ug | ORAL_TABLET | ORAL | Status: DC
  Administered 2021-09-26 – 2021-09-27 (×2): 50 ug via ORAL
  Filled 2021-09-22 (×2): qty 1

## 2021-09-22 MED ORDER — FENTANYL CITRATE PF 50 MCG/ML IJ SOSY
50.0000 ug | PREFILLED_SYRINGE | Freq: Once | INTRAMUSCULAR | Status: AC
  Administered 2021-09-22: 50 ug via INTRAVENOUS
  Filled 2021-09-22 (×2): qty 1

## 2021-09-22 MED ORDER — ONDANSETRON HCL 4 MG/2ML IJ SOLN
4.0000 mg | Freq: Four times a day (QID) | INTRAMUSCULAR | Status: DC | PRN
  Administered 2021-09-22 – 2021-09-24 (×4): 4 mg via INTRAVENOUS
  Filled 2021-09-22 (×5): qty 2

## 2021-09-22 MED ORDER — LEVOTHYROXINE SODIUM 100 MCG PO TABS
100.0000 ug | ORAL_TABLET | ORAL | Status: DC
  Administered 2021-09-22 – 2021-09-29 (×6): 100 ug via ORAL
  Filled 2021-09-22: qty 2
  Filled 2021-09-22 (×5): qty 1

## 2021-09-22 MED ORDER — METOCLOPRAMIDE HCL 5 MG/ML IJ SOLN
10.0000 mg | Freq: Once | INTRAMUSCULAR | Status: DC
  Filled 2021-09-22: qty 2

## 2021-09-22 MED ORDER — MORPHINE SULFATE (PF) 2 MG/ML IV SOLN
1.0000 mg | INTRAVENOUS | Status: DC | PRN
  Administered 2021-09-22 – 2021-09-27 (×3): 1 mg via INTRAVENOUS
  Filled 2021-09-22 (×3): qty 1

## 2021-09-22 MED ORDER — ONDANSETRON HCL 4 MG PO TABS
4.0000 mg | ORAL_TABLET | Freq: Four times a day (QID) | ORAL | Status: DC | PRN
  Administered 2021-09-25: 4 mg via ORAL
  Filled 2021-09-22: qty 1

## 2021-09-22 MED ORDER — SODIUM CHLORIDE 0.9 % IV SOLN: 250.0000 mL | INTRAVENOUS | Status: DC | PRN

## 2021-09-22 MED ORDER — ONDANSETRON 4 MG PO TBDP
4.0000 mg | ORAL_TABLET | Freq: Once | ORAL | Status: AC | PRN
  Administered 2021-09-22: 4 mg via ORAL
  Filled 2021-09-22: qty 1

## 2021-09-22 MED ORDER — VITAMIN D 25 MCG (1000 UNIT) PO TABS
1000.0000 [IU] | ORAL_TABLET | Freq: Every day | ORAL | Status: DC
  Administered 2021-09-22 – 2021-09-29 (×7): 1000 [IU] via ORAL
  Filled 2021-09-22 (×8): qty 1

## 2021-09-22 MED ORDER — SODIUM CHLORIDE 0.9% FLUSH: 3.0000 mL | INTRAVENOUS | Status: DC | PRN

## 2021-09-22 NOTE — Assessment & Plan Note (Addendum)
Patient presents for evaluation of diffuse abdominal pain associated with nausea, vomiting and diarrhea.  Diarrhea has been resolved, continue to have some nausea but able to tolerate some diet today. ?-Supportive care with IV fluid hydration, antiemetics and IV PPI ? ?

## 2021-09-22 NOTE — H&P (Signed)
?History and Physical  ? ? ?Patient: Christina Bullock ENI:[id number] DOB: [birth year] ?DOA: 09/22/2021 ?DOS: the patient was seen and examined on 09/22/2021 ?PCP: Romualdo Bolk, FNP  ?Patient coming from: Home ? ?Chief Complaint:  ?Chief Complaint  ?Patient presents with  ? Abdominal Pain  ? ?HPI: Christina Bullock is a 65 y.o. female with medical history significant for complicated GI surgical history with multiple abdominal surgeries (her current anatomy appears to be a colorectal anastomosis with bowel continuity )hypothyroidism, COPD, depression who presents to the ER for evaluation of a 2-day history of generalized abdominal pain, nausea, vomiting and diarrhea.  She rated her abdominal pain a 6 x 10 in intensity at its worst.  Pain is nonradiating. ?Patient states that she has been unable to keep any food down everything she eats runs right through her.  She denies any recent antibiotic use.  She denies having any fever, no chills, no recent travel or sick contacts, no chest pain, no shortness of breath, no headache, no dizziness, no lightheadedness, no leg swelling, no blurred vision or focal deficit. ? ?Review of Systems: As mentioned in the history of present illness. All other systems reviewed and are negative. ?Past Medical History:  ?Diagnosis Date  ? Anxiety   ? Cancer [facility])   ? endometrial  ? Depression   ? Gastritis   ? Hypothyroidism   ? Mitral prolapse   ? Pulmonary emphysema ([place])   ? Thyroid disease   ? ?Past Surgical History:  ?Procedure Laterality Date  ? BLADDER REMOVAL    ? COLON SURGERY    ? COLONOSCOPY    ? COLONOSCOPY WITH PROPOFOL N/A 06/10/2020  ? Procedure: COLONOSCOPY WITH PROPOFOL;  Surgeon: Lesly Rubenstein, MD;  Location: [facility] ENDOSCOPY;  Service: Endoscopy;  Laterality: N/A;  ? COLOSTOMY    ? COLOSTOMY REVERSAL    ? ESOPHAGOGASTRODUODENOSCOPY (EGD) WITH PROPOFOL N/A 06/10/2020  ? Procedure: ESOPHAGOGASTRODUODENOSCOPY (EGD) WITH PROPOFOL;  Surgeon: Lesly Rubenstein, MD;   Location: [facility] ENDOSCOPY;  Service: Endoscopy;  Laterality: N/A;  ? ILEOSTOMY    ? THYROID SURGERY    ? UPPER GI ENDOSCOPY    ? ?Social History:  reports that she has been smoking cigarettes. She has a 50.00 pack-year smoking history. She has never used smokeless tobacco. She reports that she does not drink alcohol and does not use drugs. ? ?Allergies  ?Allergen Reactions  ? Iohexol Anaphylaxis  ? Phenylephrine Anaphylaxis  ?  Heart races  ? Albuterol   ? Albuterol-Ipratropium-Soybean Lecithin [Ipratropium-Albuterol] Other (See Comments)  ?  Heart racing  ? Benadryl [Diphenhydramine] Other (See Comments)  ?  Heart racing  ? Codeine Nausea Only  ? Contrast Media [Iodinated Contrast Media]   ? Effexor [Venlafaxine] Other (See Comments)  ?  Slows her down  ? Erythromycin Nausea Only  ? Kenalog [Triamcinolone]   ? Nitrofurantoin Macrocrystal   ? Other   ?  aectylpropalomine  ? Sulfa Antibiotics Other (See Comments)  ?  headache  ? Sulfasalazine   ? Ciprofloxacin Anxiety  ? Demerol [Meperidine] Anxiety  ? Floxin [Ofloxacin] Anxiety  ? Stadol [Butorphanol] Anxiety  ? ? ?Family History  ?Adopted: Yes  ?Problem Relation Age of Onset  ? Breast cancer Neg Hx   ? ? ?Prior to Admission medications   ?Medication Sig Start Date End Date Taking? Authorizing Provider  ?cholecalciferol (VITAMIN D) 1000 units tablet Take 1,000 Units by mouth daily.   Yes [provider]  ?Cyanocobalamin (VITAMIN B-12 IJ) Inject as directed.  Yes [provider]  ?levothyroxine (SYNTHROID) 50 MCG tablet Two tabs (126mg) Monday-Friday.  One tab (575m) each week on Saturday and Sunday. 01/08/20  Yes [provider]  ?busPIRone (BUSPAR) 5 MG tablet Take 5 mg by mouth daily. ?Patient not taking: Reported on 06/10/2020    [provider]  ?pantoprazole (PROTONIX) 20 MG tablet Take 20 mg by mouth daily.    [provider]  ? ? ?Physical Exam: ?Vitals:  ? 09/22/21 0800 09/22/21 0830 09/22/21 0900 09/22/21 0930   ?BP: (!) 133/53 (!) 129/58 (!) 133/51 (!) 131/58  ?Pulse: 87 86 90 85  ?Resp: (!) 24 (!) 25 (!) 21 (!) 24  ?Temp:      ?TempSrc:      ?SpO2: 96% 94% 94% 92%  ?Weight:      ?Height:      ? ?Physical Exam ?Vitals and nursing note reviewed.  ?Constitutional:   ?   Appearance: She is well-developed.  ?HENT:  ?   Head: Normocephalic and atraumatic.  ?   Mouth/Throat:  ?   Mouth: Mucous membranes are moist.  ?Cardiovascular:  ?   Rate and Rhythm: Normal rate and regular rhythm.  ?Pulmonary:  ?   Effort: Pulmonary effort is normal.  ?   Breath sounds: Normal breath sounds.  ?Abdominal:  ?   General: Abdomen is flat. Bowel sounds are normal.  ?   Palpations: Abdomen is soft.  ?   Tenderness: There is generalized abdominal tenderness.  ?   Comments: Multiple surgical scars  ?Skin: ?   General: Skin is warm and dry.  ?Neurological:  ?   General: No focal deficit present.  ?   Mental Status: She is alert.  ?Psychiatric:     ?   Mood and Affect: Mood normal.     ?   Behavior: Behavior normal.  ? ? ?Data Reviewed: ?Relevant notes from primary care and specialist visits, past discharge summaries as available in EHR, including Care Everywhere. ?Prior diagnostic testing as pertinent to current admission diagnoses ?Updated medications and problem lists for reconciliation ?ED course, including vitals, labs, imaging, treatment and response to treatment ?Triage notes, nursing and pharmacy notes and ED provider's notes ?Notable results as noted in HPI ?Labs reviewed.  Sodium 137, potassium 4.4, chloride 103, bicarb 26, glucose 146, BUN 17, creatinine 1.14, calcium 9.4, total protein 7.2, albumin 4.2, AST 31, ALT 19, alkaline phosphatase 63, white count 14.6, hemoglobin 14.3, notably 45.1, platelet count 219 ?CT scan of abdomen and pelvis shows increased fullness of both adrenal glands with mild periadrenal ?edema. This is nonspecific and could indicate adrenal congestion or impending adrenal crisis or adrenal hemorrhage. Correlate  clinically with the adrenal hormone levels. Follow-up as indicated. ?Postsurgical changes of the stomach, small and large bowel with feces filling of a postsurgical right mid abdominal small bowel segment but no significant upstream small bowel dilatation. Mild hepatic steatosis. ?2 stable 4 mm left lower lobe nodules. Osteopenia and degenerative change. ?Twelve-lead EKG reviewed by me shows normal sinus rhythm with no acute ST or T wave changes ?There are no new results to review at this time. ? ?Assessment and Plan: ?* Gastroenteritis ?Patient presents for evaluation of diffuse abdominal pain associated with nausea, vomiting and diarrhea. ?Send stool PCR ?Rule out infection with C. difficile toxin ?Supportive care with IV fluid hydration, antiemetics and IV PPI ?Check and supplement electrolytes ? ?Hypothyroidism ?Stable ?Continue Synthroid ? ?Adrenal hemorrhage ([place]?Patient had a CT scan of abdomen and pelvis  which showed increased fullness of both adrenal glands with mild periadrenal edema.  Findings could be nonspecific and could indicate adrenal congestion or impending adrenal crisis or adrenal hemorrhage. ?Awaiting results of cortisol levels ?Patient is not hypotensive and does not have hyperkalemia or hyponatremia ?H&H is also stable ?Monitor patient closely ? ? ? ? ? Advance Care Planning:   Code Status: Full Code  ? ?Consults: None ? ?Family Communication: Greater than 50% of time was spent discussing patient's condition and plan of care with her at the bedside.  All questions and concerns have been addressed.  She verbalizes understanding and agrees with the plan. ? ?Severity of Illness: ?The appropriate patient status for this patient is OBSERVATION. Observation status is judged to be reasonable and necessary in order to provide the required intensity of service to ensure the patient's safety. The patient's presenting symptoms, physical exam findings, and initial radiographic and laboratory data in the  context of their medical condition is felt to place them at decreased risk for further clinical deterioration. Furthermore, it is anticipated that the patient will be medically stable for discharge from the [facility]

## 2021-09-22 NOTE — ED Triage Notes (Signed)
Pt presents via EMS from home with complaints of AP, N/V, and diarrhea that started 2 days ago that has progressed over today. Multiple episodes of emesis today. No meds given PTA. Denies CP or SOB.  ?

## 2021-09-22 NOTE — Assessment & Plan Note (Addendum)
Stable Continue Synthroid 

## 2021-09-22 NOTE — ED Notes (Signed)
Patient assisted to bedside commode.

## 2021-09-22 NOTE — ED Notes (Signed)
Pt endorsing abdominal pain at this time. MD messaged securely through chat ?

## 2021-09-22 NOTE — Assessment & Plan Note (Addendum)
Patient had a CT scan of abdomen and pelvis which showed increased fullness of both adrenal glands with mild periadrenal edema.  Findings could be nonspecific and could indicate adrenal congestion or impending adrenal crisis or adrenal hemorrhage.  A.m. cortisol's levels elevated at 54.7 ?Patient is not hypotensive and does not have hyperkalemia or hyponatremia ?H&H is also stable ?Monitor patient closely ?-We will repeat a.m. cortisol tomorrow ?-If remain elevated patient will need outpatient endocrinology evaluation ?

## 2021-09-22 NOTE — ED Notes (Signed)
Patient room air sats 86-88% with good wave form. Patient placed on 2L via [place], with improvement of sats to 95%. ?

## 2021-09-22 NOTE — ED Provider Notes (Signed)
? ?[facility] ?Provider Note ? ? ? Event Date/Time  ? First MD Initiated Contact with Patient 09/22/21 0256   ?  (approximate) ? ? ?History  ? ?Abdominal Pain ? ? ?HPI ? ?Christina Bullock is a 66 y.o. female with history of endometrial cancer, emphysema, hypothyroidism who presents to the emergency department with complaints of generalized abdominal pain, nausea, vomiting and diarrhea that started today.  Patient's had multiple abdominal surgeries.  Please see below.  She denies any dysuria, hematuria, vaginal bleeding or discharge.  No sick contacts or recent travel.  No fever, chest pain or shortness of breath. ? ?"She has a complicated gastrointestinal surgical history which seems to date back to the mid 1980s when she reports having multiple surgeries for what sounds like endometriosis which was adherent to the colon. She apparently did well until she was admitted to 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 She underwent 2 small bowel resections and resection of the pelvic abscess cavity with an associated injury to the bladder which was repaired primarily. Her care was then transferred to [facility] and she has been followed closely by Dr. Launa Flight. 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 She ultimately required a left hemicolectomy and repair of enterovesicular fistula. She did have a diverting loop ileostomy for a period of time which has since been taken down. Her current anatomy appears to be a colorectal anastomosis with bowel continuity otherwise."-This history was copied from Dr Angus Palms note at [facility] ? ? ?History provided by patient. ? ? ? ?Past Medical History:  ?Diagnosis Date  ? Anxiety   ? Cancer [facility])   ?  endometrial  ? Depression   ? Gastritis   ? Hypothyroidism   ? Mitral prolapse   ? Pulmonary emphysema ([place])   ? Thyroid disease   ? ? ?Past Surgical History:  ?Procedure Laterality Date  ? BLADDER REMOVAL    ? COLON SURGERY    ? COLONOSCOPY    ? COLONOSCOPY WITH PROPOFOL N/A 06/10/2020  ? Procedure: COLONOSCOPY WITH PROPOFOL;  Surgeon: Lesly Rubenstein, MD;  Location: [facility] ENDOSCOPY;  Service: Endoscopy;  Laterality: N/A;  ? COLOSTOMY    ? COLOSTOMY REVERSAL    ? ESOPHAGOGASTRODUODENOSCOPY (EGD) WITH PROPOFOL N/A 06/10/2020  ? Procedure: ESOPHAGOGASTRODUODENOSCOPY (EGD) WITH PROPOFOL;  Surgeon: Lesly Rubenstein, MD;  Location: [facility] ENDOSCOPY;  Service: Endoscopy;  Laterality: N/A;  ? ILEOSTOMY    ? THYROID SURGERY    ? UPPER GI ENDOSCOPY    ? ? ?MEDICATIONS:  ?Prior to Admission medications   ?Medication Sig Start Date End Date Taking? Authorizing Provider  ?busPIRone (BUSPAR) 5 MG tablet Take 5 mg by mouth daily. ?Patient not taking: Reported on 06/10/2020    [provider]  ?cholecalciferol (VITAMIN D) 1000 units tablet Take 1,000 Units by mouth daily.    [provider]  ?Cyanocobalamin (VITAMIN B-12 IJ) Inject as directed.    [provider]  ?levothyroxine (SYNTHROID, LEVOTHROID) 75 MCG tablet Take 75 mcg by mouth daily before breakfast.    [provider]  ?pantoprazole (PROTONIX) 20 MG tablet Take 20 mg by mouth daily. ?Patient not taking: Reported on 06/10/2020    [provider]  ? ? ?Physical Exam  ? ?Triage Vital Signs: ?ED Triage Vitals  ?  Enc Vitals Group  ?   BP 09/22/21 0023 (!) 158/69  ?   Pulse Rate 09/22/21 0023 94  ?   Resp 09/22/21 0023 19  ?   Temp 09/22/21 0023 98.7 ?F (37.1 ?C)  ?   Temp Source 09/22/21 0023 Oral  ?   SpO2 09/22/21 0023 90 %  ?   Weight 09/22/21 0021 182 lb (82.6 kg)  ?   Height 09/22/21 0021 '5\' 6"'$  (1.676 m)  ?   Head Circumference --   ?   Peak Flow --   ?   Pain Score 09/22/21 0021 6  ?   Pain Loc --   ?   Pain Edu? --   ?   Excl.  in [place]? --   ? ? ?Most recent vital signs: ?Vitals:  ? 09/22/21 0530 09/22/21 0600  ?BP: (!) 136/57 (!) 137/124  ?Pulse: 87   ?Resp: (!) 27 18  ?Temp:    ?SpO2: 92%   ? ? ?CONSTITUTIONAL: Alert and oriented and responds appropriately to questions. Well-appearing; well-nourished ?HEAD: Normocephalic, atraumatic ?EYES: Conjunctivae clear, pupils appear equal, sclera nonicteric ?ENT: normal nose; moist mucous membranes ?NECK: Supple, normal ROM ?CARD: RRR; S1 and S2 appreciated; no murmurs, no clicks, no rubs, no gallops ?RESP: Normal chest excursion without splinting or tachypnea; breath sounds clear and equal bilaterally; no wheezes, no rhonchi, no rales, no hypoxia or respiratory distress, speaking full sentences ?ABD/GI: Normal bowel sounds; non-distended; soft, diffusely tender without guarding or rebound ?BACK: The back appears normal ?EXT: Normal ROM in all joints; no deformity noted, no edema; no cyanosis ?SKIN: Normal color for age and race; warm; no rash on exposed skin ?NEURO: Moves all extremities equally, normal speech ?PSYCH: The patient's mood and manner are appropriate. ? ? ?ED Results / Procedures / Treatments  ? ?LABS: ?(all labs ordered are listed, but only abnormal results are displayed) ?Labs Reviewed  ?COMPREHENSIVE METABOLIC PANEL - Abnormal; Notable for the following components:  ?    Result Value  ? Glucose, Bld 146 (*)   ? Creatinine, Ser 1.14 (*)   ? GFR, Estimated 53 (*)   ? All other components within normal limits  ?CBC - Abnormal; Notable for the following components:  ? WBC 14.6 (*)   ? All other components within normal limits  ?URINALYSIS, ROUTINE W REFLEX MICROSCOPIC - Abnormal; Notable for the following components:  ? Color, Urine YELLOW (*)   ? APPearance CLEAR (*)   ? Hgb urine dipstick SMALL (*)   ? All other components within normal limits  ?GASTROINTESTINAL PANEL BY PCR, STOOL (REPLACES STOOL CULTURE)  ?C DIFFICILE QUICK SCREEN W PCR REFLEX    ?LIPASE, BLOOD  ?CORTISOL-AM, BLOOD   ? ? ? ?EKG: ? ? ? ? ?RADIOLOGY: ?My personal review and interpretation of imaging: CT imaging shows no acute intra-abdominal pathology other than fullness of the adrenal glands. ? ?I have personally reviewed all radiology reports.   ?CT ABDOMEN PELVIS WO CONTRAST ? ?Result Date: 09/22/2021 ?CLINICAL DATA:  Abdominal pain and diarrhea progressive over previous 3 days. EXAM: CT ABDOMEN AND PELVIS WITHOUT CONTRAST TECHNIQUE: Multidetector CT imaging of the abdomen and pelvis was performed following the standard protocol without IV contrast. RADIATION DOSE REDUCTION: This exam was performed according to the departmental dose-optimization program which includes automated exposure control, adjustment of the mA and/or kV according to patient size and/or use of iterative reconstruction technique. COMPARISON:  CT without contrast 02/17/2019 FINDINGS: Lower chest: There are scattered linear scar-like  opacities in the lung bases. There is chronic pleural-parenchymal disease in the lateral left base. There are 2 stable 4 mm left lower lobe noncalcified nodules on series 4 axial 10 and axial 14. The remaining lung bases are clear. The cardiac size is normal. There is no pericardial effusion. Hepatobiliary: There is mild hepatic hypodensity consistent with steatosis. No focal lesion is seen without contrast. The gallbladder and bile ducts are unremarkable. Pancreas: No focal abnormality or adjacent edema. Spleen: Normal in size and noncontrast attenuation. Adrenals/Urinary Tract: There is bilateral mild periadrenal edema mild increased nonmasslike prominence of both adrenal glands. The unenhanced renal cortex is unremarkable. There is no urinary stone or obstruction. There is no bladder thickening. Stomach/Bowel: There is a suture line inferiorly at the distal body of the stomach, seen previously without inflammatory changes. There is small bowel anastomosis right mid abdomen with fecal material in the postsurgical small bowel  segment. No small bowel obstruction or inflammatory changes are seen. There is a suture line at the rectosigmoid junction. No findings of acute colitis or diverticulitis. Vascular/Lymphatic: Aortic atheroscl

## 2021-09-23 ENCOUNTER — Observation Stay: Payer: Medicare HMO

## 2021-09-23 ENCOUNTER — Inpatient Hospital Stay: Payer: Medicare HMO

## 2021-09-23 DIAGNOSIS — R1010 Upper abdominal pain, unspecified: Secondary | ICD-10-CM | POA: Diagnosis not present

## 2021-09-23 DIAGNOSIS — R0902 Hypoxemia: Secondary | ICD-10-CM | POA: Diagnosis not present

## 2021-09-23 DIAGNOSIS — Z79899 Other long term (current) drug therapy: Secondary | ICD-10-CM | POA: Diagnosis not present

## 2021-09-23 DIAGNOSIS — E669 Obesity, unspecified: Secondary | ICD-10-CM | POA: Diagnosis present

## 2021-09-23 DIAGNOSIS — E039 Hypothyroidism, unspecified: Secondary | ICD-10-CM | POA: Diagnosis present

## 2021-09-23 DIAGNOSIS — Z79890 Hormone replacement therapy (postmenopausal): Secondary | ICD-10-CM | POA: Diagnosis not present

## 2021-09-23 DIAGNOSIS — R112 Nausea with vomiting, unspecified: Secondary | ICD-10-CM | POA: Diagnosis present

## 2021-09-23 DIAGNOSIS — K760 Fatty (change of) liver, not elsewhere classified: Secondary | ICD-10-CM | POA: Diagnosis present

## 2021-09-23 DIAGNOSIS — R197 Diarrhea, unspecified: Secondary | ICD-10-CM | POA: Diagnosis not present

## 2021-09-23 DIAGNOSIS — Z6829 Body mass index (BMI) 29.0-29.9, adult: Secondary | ICD-10-CM | POA: Diagnosis not present

## 2021-09-23 DIAGNOSIS — R609 Edema, unspecified: Secondary | ICD-10-CM | POA: Diagnosis present

## 2021-09-23 DIAGNOSIS — Z882 Allergy status to sulfonamides status: Secondary | ICD-10-CM | POA: Diagnosis not present

## 2021-09-23 DIAGNOSIS — E2749 Other adrenocortical insufficiency: Secondary | ICD-10-CM | POA: Diagnosis not present

## 2021-09-23 DIAGNOSIS — K529 Noninfective gastroenteritis and colitis, unspecified: Secondary | ICD-10-CM | POA: Diagnosis present

## 2021-09-23 DIAGNOSIS — J90 Pleural effusion, not elsewhere classified: Secondary | ICD-10-CM | POA: Diagnosis not present

## 2021-09-23 DIAGNOSIS — J438 Other emphysema: Secondary | ICD-10-CM | POA: Diagnosis present

## 2021-09-23 DIAGNOSIS — J189 Pneumonia, unspecified organism: Secondary | ICD-10-CM | POA: Diagnosis not present

## 2021-09-23 DIAGNOSIS — R0602 Shortness of breath: Secondary | ICD-10-CM | POA: Diagnosis not present

## 2021-09-23 DIAGNOSIS — Z885 Allergy status to narcotic agent status: Secondary | ICD-10-CM | POA: Diagnosis not present

## 2021-09-23 DIAGNOSIS — Z888 Allergy status to other drugs, medicaments and biological substances status: Secondary | ICD-10-CM | POA: Diagnosis not present

## 2021-09-23 DIAGNOSIS — M8580 Other specified disorders of bone density and structure, unspecified site: Secondary | ICD-10-CM | POA: Diagnosis present

## 2021-09-23 DIAGNOSIS — E278 Other specified disorders of adrenal gland: Secondary | ICD-10-CM | POA: Diagnosis present

## 2021-09-23 DIAGNOSIS — F17210 Nicotine dependence, cigarettes, uncomplicated: Secondary | ICD-10-CM | POA: Diagnosis present

## 2021-09-23 DIAGNOSIS — Z20822 Contact with and (suspected) exposure to covid-19: Secondary | ICD-10-CM | POA: Diagnosis present

## 2021-09-23 LAB — CBC
HCT: 43 % (ref 36.0–46.0)
Hemoglobin: 13.6 g/dL (ref 12.0–15.0)
MCH: 30 pg (ref 26.0–34.0)
MCHC: 31.6 g/dL (ref 30.0–36.0)
MCV: 94.7 fL (ref 80.0–100.0)
Platelets: 166 10*3/uL (ref 150–400)
RBC: 4.54 MIL/uL (ref 3.87–5.11)
RDW: 13 % (ref 11.5–15.5)
WBC: 10.8 10*3/uL — ABNORMAL HIGH (ref 4.0–10.5)
nRBC: 0 % (ref 0.0–0.2)

## 2021-09-23 LAB — RESPIRATORY PANEL BY PCR

## 2021-09-23 LAB — BASIC METABOLIC PANEL
Anion gap: 7 (ref 5–15)
BUN: 15 mg/dL (ref 8–23)
CO2: 25 mmol/L (ref 22–32)
Calcium: 8.2 mg/dL — ABNORMAL LOW (ref 8.9–10.3)
Chloride: 105 mmol/L (ref 98–111)
Creatinine, Ser: 1.22 mg/dL — ABNORMAL HIGH (ref 0.44–1.00)
GFR, Estimated: 49 mL/min — ABNORMAL LOW (ref >60)
Glucose, Bld: 113 mg/dL — ABNORMAL HIGH (ref 70–99)
Potassium: 3.8 mmol/L (ref 3.5–5.1)
Sodium: 137 mmol/L (ref 135–145)

## 2021-09-23 LAB — RESP PANEL BY RT-PCR (FLU A&B, COVID) ARPGX2
Influenza A by PCR: NEGATIVE
Influenza B by PCR: NEGATIVE
SARS Coronavirus 2 by RT PCR: NEGATIVE

## 2021-09-23 LAB — HIV ANTIBODY (ROUTINE TESTING W REFLEX): HIV Screen 4th Generation wRfx: NONREACTIVE

## 2021-09-23 LAB — PROCALCITONIN: Procalcitonin: 18.09 ng/mL

## 2021-09-23 MED ORDER — ORAL CARE MOUTH RINSE
15.0000 mL | Freq: Two times a day (BID) | OROMUCOSAL | Status: DC
  Administered 2021-09-23 – 2021-09-29 (×9): 15 mL via OROMUCOSAL

## 2021-09-23 MED ORDER — LEVALBUTEROL HCL 0.63 MG/3ML IN NEBU
0.6300 mg | INHALATION_SOLUTION | Freq: Once | RESPIRATORY_TRACT | Status: AC
  Administered 2021-09-23: 0.63 mg via RESPIRATORY_TRACT
  Filled 2021-09-23: qty 3

## 2021-09-23 NOTE — Hospital Course (Addendum)
Taken from H&P.  Christina Bullock is a 66 y.o. female with medical history significant for complicated GI surgical history with multiple abdominal surgeries (her current anatomy appears to be a colorectal anastomosis with bowel continuity )hypothyroidism, COPD, depression who presents to the ER for evaluation of a 2-day history of generalized abdominal pain, nausea, vomiting and diarrhea.  She rated her abdominal pain a 6 x 10 in intensity at its worst.  Pain is nonradiating. Patient states that she has been unable to keep any food down everything she eats runs right through her.  She denies any recent antibiotic use.  She denies having any fever, no chills, no recent travel or sick contacts, no chest pain, no shortness of breath, no headache, no dizziness, no lightheadedness, no leg swelling, no blurred vision or focal deficit.  On arrival to ED she was hemodynamically stable.  Labs pertinent for creatinine of 1.14 with baseline less than 1, mild leukocytosis at 14.6. CT abdomen and pelvis shows increased fullness of both adrenal glands with mild peri and renal edema.  This is nonspecific and could indicate adrenal congestion or impending adrenal crisis or adrenal hemorrhage.  Follow-up required. Postsurgical changes of the stomach, small and large bowel with feces filling of a postsurgical right mid abdominal small bowel segment but no significant upstream small bowel dilatation. Mild hepatic steatosis. 2 stable 4 mm left lower lobe nodules. Osteopenia and degenerative change.  A.m. cortisol were elevated at 54.7.  TSH was within normal limit. Patient had pretty much normal EGD and colonoscopy a year ago, biopsies were negative for H. pylori or any malignancy.  5/17: Patient developed some shortness of breath overnight, chest x-ray obtained which shows patchy opacities in left lung field.  Oxygen requirement increased from 2 to 4 L Diarrhea has been resolved, no bowel movement since in the  hospital.  Discontinue enteric precautions.  Patient need outpatient endocrinology follow-up.  5/18: Patient continued to feel unwell.  Having nausea and productive cough.  Remained on 3 to 4 L of oxygen with no baseline oxygen use.  5/19: Patient overall seems improving, some improvement in cough.  Continue to require up to 3 L of oxygen.  Patient has a lot of allergies and intolerances to medications.  She does not want Zithromax as it makes her nauseated.  She refused to take Augmentin.  She does not want fluoroquinolone as they also make her sick.  She refused to take Doxy.  Eventually agrees to take cefpodoxime for pneumonia. PT/OT evaluation still pending.  5/20: Patient continued to improve but still requiring 2 to 3 L of oxygen.  PT and OT are recommending rehab but patient would like to go home with home health services and asking to work with PT again today as she thinks that she might be able to participate more now.  5/21: Patient with worsening abdominal pain, had 1 regular bowel movement yesterday.  No nausea or vomiting.  Unable to tolerate any food because of pain. Patient has an history of multiple abdominal surgeries.  Repeat CT abdomen ordered.  5/22: Repeat CT abdomen was without any acute abnormality.  Prior findings of lingular pneumonia with mild left pleural effusion was noted. Patient continued to experience abdominal pain after eating.  No nausea or vomiting.  She seems very frustrated.  GI was consulted.  No significant atherosclerosis noted on CT abdomen but might need CTA abdomen and pelvis to rule out any mesenteric ischemia. OT recommendations remained for SNF, PT now recommending home  health services.  Patient wants to go home with home health.  5/23: GI evaluated her yesterday.  Multiple differentials, they recommend pancreatic fecal elastase levels due to her history of chronic tobacco use to rule out exocrine pancreatic insufficiency as patient is high risk for  chronic pancreatitis. They also recommend checking for celiac panel-which was sent and pending. Patient was started on empiric trial of Creon with each meal and 1 with snack. She was also started on twice daily Protonix. Per GI due to her history of multiple GI surgeries, small intestinal bacterial overgrowth is also on differential and they can empirically treat with Xifaxan 550 mg 3 times daily for 2 weeks if the above work-up is negative. Patient need to follow-up with her primary gastroenterologist, Christina Bullock closely for further management. As she has pretty normal endoscopy and colonoscopy in 2022, no need to repeat procedures at this time.  Feeling much improved today. She wants to go home, maximum home health services ordered.  She will continue with current medications and follow up with her providers.

## 2021-09-23 NOTE — Progress Notes (Addendum)
? ?      CROSS COVER NOTE ? ?NAME: Christina Bullock ?MRN: [id number] ?DOB : 12-Jan-1956 ? ? ?Secure chat received from nursing "Pt O2 demand has increased overnight. Was 2L, now 3-4L. SpO2 low 90s., Lungs w/ exp wheezes and some rhonchi (present but less pronounced at beginning of shift). Voided 3 times overnight but dark and concentrated despite IVF '@100'$  and PO fluid intake. I'm wondering if she's retaining fluid in her lungs. Could we check a routine chest xray this AM?" ? ?On review of chart supplemental O2 was increased from 2L to 4L at 2000 09/22/2021 and has since been titrated down to 3L. ? ?Plan: ? ?- CXR ?- Pause IVF ?- Xopenex ? ?Neomia Glass DNP, MHA, FNP-BC ?Nurse Practitioner ?[facility] ?[place] ?Pager [phone] ? ?

## 2021-09-23 NOTE — Progress Notes (Addendum)
?Progress Note ? ? ?Patient: Christina Bullock VFI:[id number] DOB: 1955-06-26 DOA: 09/22/2021     0 ?DOS: the patient was seen and examined on 09/23/2021 ?  ?Brief hospital course: ?Taken from H&P. ? ?Christina Bullock is a 67 y.o. female with medical history significant for complicated GI surgical history with multiple abdominal surgeries (her current anatomy appears to be a colorectal anastomosis with bowel continuity )hypothyroidism, COPD, depression who presents to the ER for evaluation of a 2-day history of generalized abdominal pain, nausea, vomiting and diarrhea.  She rated her abdominal pain a 6 x 10 in intensity at its worst.  Pain is nonradiating. ?Patient states that she has been unable to keep any food down everything she eats runs right through her.  She denies any recent antibiotic use.  She denies having any fever, no chills, no recent travel or sick contacts, no chest pain, no shortness of breath, no headache, no dizziness, no lightheadedness, no leg swelling, no blurred vision or focal deficit. ? ?On arrival to ED she was hemodynamically stable.  Labs pertinent for creatinine of 1.14 with baseline less than 1, mild leukocytosis at 14.6. ?CT abdomen and pelvis shows increased fullness of both adrenal glands with mild peri and renal edema.  This is nonspecific and could indicate adrenal congestion or impending adrenal crisis or adrenal hemorrhage.  Follow-up required. ?Postsurgical changes of the stomach, small and large bowel with feces filling of a postsurgical right mid abdominal small bowel segment but no significant upstream small bowel dilatation. Mild hepatic steatosis. 2 stable 4 mm left lower lobe nodules. Osteopenia and degenerative change. ? ?A.m. cortisol were elevated at 54.7.  TSH was within normal limit. ?Patient had pretty much normal EGD and colonoscopy a year ago, biopsies were negative for H. pylori or any malignancy. ? ?5/17: Patient developed some shortness of breath  overnight, chest x-ray obtained which shows patchy opacities in left lung field.  Oxygen requirement increased from 2 to 4 L ?Diarrhea has been resolved, no bowel movement since in the hospital.  Discontinue enteric precautions.  Patient need outpatient endocrinology follow-up. ? ? ?Assessment and Plan: ?* Gastroenteritis ?Patient presents for evaluation of diffuse abdominal pain associated with nausea, vomiting and diarrhea.  Diarrhea has been resolved, continue to have some nausea but able to tolerate some diet today. ?-Supportive care with IV fluid hydration, antiemetics and IV PPI ? ? ?Shortness of breath ?Patient developed some shortness of breath and hypoxia overnight.  Chest x-ray at that time with left-sided patchy opacities.  Worsening cough. ?COVID-19 was not done on admission. ?-Check COVID-19 and influenza PCR ?-Respiratory viral panel ?-Repeat two-view chest x-ray ?-Continue with supportive care ? ?Hypothyroidism ?Stable ?-Continue Synthroid ? ?Adrenal hemorrhage ([place]) ?Patient had a CT scan of abdomen and pelvis which showed increased fullness of both adrenal glands with mild periadrenal edema.  Findings could be nonspecific and could indicate adrenal congestion or impending adrenal crisis or adrenal hemorrhage.  A.m. cortisol's levels elevated at 54.7 ?Patient is not hypotensive and does not have hyperkalemia or hyponatremia ?H&H is also stable ?Monitor patient closely ?-We will repeat a.m. cortisol tomorrow ?-If remain elevated patient will need outpatient endocrinology evaluation ? ? ?Subjective: Patient continued to feel little short of breath and cough.  Diarrhea has been resolved, no bowel movement since in the hospital.  Continue to have mild nausea, no current vomiting. ? ?Physical Exam: ?Vitals:  ? 09/23/21 0034 09/23/21 0423 09/23/21 0736 09/23/21 1540  ?BP:  134/65 135/65 132/63  ?Pulse:  95  100 (!) 102  ?Resp:  '14 17 16  '$ ?Temp:  97.9 ?F (36.6 ?C) 98.1 ?F (36.7 ?C) 99.4 ?F (37.4 ?C)   ?TempSrc:  Oral    ?SpO2: 93% 95% 93% 92%  ?Weight:      ?Height:      ? ?General.     In no acute distress. ?Pulmonary.  Scattered rhonchi on left, normal respiratory effort. ?CV.  Regular rate and rhythm, no JVD, rub or murmur. ?Abdomen.  Soft, nontender, nondistended, BS positive. ?CNS.  Alert and oriented .  No focal neurologic deficit. ?Extremities.  No edema, no cyanosis, pulses intact and symmetrical. ?Psychiatry.  Judgment and insight appears normal. ? ?Data Reviewed: ?Prior notes, labs and images reviewed ? ?Family Communication: Discussed with patient ? ?Disposition: ?Status is: Inpatient ?Remains inpatient appropriate because: Severity of illness ? ? Planned Discharge Destination: Home ? ?Time spent: 50 minutes ? ?This record has been created using Systems analyst. Errors have been sought and corrected,but may not always be located. Such creation errors do not reflect on the standard of care. ? ?Author: ?Lorella Nimrod, MD ?09/23/2021 5:04 PM ? ?For on call review [email or web address].  ?

## 2021-09-23 NOTE — Progress Notes (Signed)
Notified, Neomia Glass, provider on-call regarding pt's increased O2 demand over night (initially 2L now 3-4NC) along with dyspnea with exertion and rhonchi/expiratory wheezing on auscultation. Order received for Chest Xray. Will continue to monitor. ?

## 2021-09-23 NOTE — Progress Notes (Signed)
?   09/23/21 1955  ?Assess: MEWS Score  ?Temp 99.6 ?F (37.6 ?C)  ?BP 120/83  ?Pulse Rate (!) 118 ?(Reported to RN Nurse Ventura)  ?Resp 16  ?SpO2 94 %  ?O2 Device Nasal Cannula  ?Assess: MEWS Score  ?MEWS Temp 0  ?MEWS Systolic 0  ?MEWS Pulse 2  ?MEWS RR 0  ?MEWS LOC 0  ?MEWS Score 2  ?MEWS Score Color Yellow  ?Assess: if the MEWS score is Yellow or Red  ?Were vital signs taken at a resting state? Yes  ?Focused Assessment No change from prior assessment  ?Does the patient meet 2 or more of the SIRS criteria? No  ?MEWS guidelines implemented *See Row Information* Yes  ?Treat  ?MEWS Interventions Other (Comment) ?(monitoring)  ?Take Vital Signs  ?Increase Vital Sign Frequency  Yellow: Q 2hr X 2 then Q 4hr X 2, if remains yellow, continue Q 4hrs  ?Escalate  ?MEWS: Escalate Yellow: discuss with charge nurse/RN and consider discussing with provider and RRT  ?Notify: Charge Nurse/RN  ?Name of Charge Nurse/RN Notified Frances Furbish RN  ?Date Charge Nurse/RN Notified 09/23/21  ?Time Charge Nurse/RN Notified 2200  ?Document  ?Patient Outcome Other (Comment) ?(stable continung to monitor)  ?Progress note created (see row info) Yes  ?Assess: SIRS CRITERIA  ?SIRS Temperature  0  ?SIRS Pulse 1  ?SIRS Respirations  0  ?SIRS WBC 0  ?SIRS Score Sum  1  ? ? ?

## 2021-09-23 NOTE — Assessment & Plan Note (Addendum)
Patient developed some shortness of breath and hypoxia overnight.  Chest x-ray at that time with left-sided patchy opacities.  Worsening cough. COVID-19 and influenza PCR negative, respiratory viral panel negative. CT chest concerning for left upper lobe and lingular pneumonia.  Procalcitonin elevated. -Start her on ceftriaxone. -Also received one half dose of Zithromax and developed nausea and vomiting, apparently had intolerance to Zithromax in the past. -Continue with supplemental oxygen-wean as tolerated -Continue with supportive care

## 2021-09-24 DIAGNOSIS — K529 Noninfective gastroenteritis and colitis, unspecified: Secondary | ICD-10-CM | POA: Diagnosis not present

## 2021-09-24 LAB — CBC
HCT: 39 % (ref 36.0–46.0)
Hemoglobin: 12.6 g/dL (ref 12.0–15.0)
MCH: 30 pg (ref 26.0–34.0)
MCHC: 32.3 g/dL (ref 30.0–36.0)
MCV: 92.9 fL (ref 80.0–100.0)
Platelets: 151 10*3/uL (ref 150–400)
RBC: 4.2 MIL/uL (ref 3.87–5.11)
RDW: 13.1 % (ref 11.5–15.5)
WBC: 7.4 10*3/uL (ref 4.0–10.5)
nRBC: 0 % (ref 0.0–0.2)

## 2021-09-24 LAB — PROCALCITONIN: Procalcitonin: 19.1 ng/mL

## 2021-09-24 LAB — TSH: TSH: 2.516 u[IU]/mL (ref 0.350–4.500)

## 2021-09-24 LAB — BASIC METABOLIC PANEL
Anion gap: 8 (ref 5–15)
BUN: 21 mg/dL (ref 8–23)
CO2: 25 mmol/L (ref 22–32)
Calcium: 8.5 mg/dL — ABNORMAL LOW (ref 8.9–10.3)
Chloride: 101 mmol/L (ref 98–111)
Creatinine, Ser: 1.21 mg/dL — ABNORMAL HIGH (ref 0.44–1.00)
GFR, Estimated: 49 mL/min — ABNORMAL LOW (ref >60)
Glucose, Bld: 127 mg/dL — ABNORMAL HIGH (ref 70–99)
Potassium: 3.7 mmol/L (ref 3.5–5.1)
Sodium: 134 mmol/L — ABNORMAL LOW (ref 135–145)

## 2021-09-24 LAB — CORTISOL-AM, BLOOD: Cortisol - AM: 41.8 ug/dL — ABNORMAL HIGH (ref 6.7–22.6)

## 2021-09-24 MED ORDER — SODIUM CHLORIDE 0.9 % IV SOLN
2.0000 g | INTRAVENOUS | Status: DC
  Administered 2021-09-24 – 2021-09-25 (×2): 2 g via INTRAVENOUS
  Filled 2021-09-24: qty 20
  Filled 2021-09-24: qty 2

## 2021-09-24 MED ORDER — SODIUM CHLORIDE 0.9 % IV SOLN
500.0000 mg | INTRAVENOUS | Status: DC
  Administered 2021-09-24: 500 mg via INTRAVENOUS
  Filled 2021-09-24 (×2): qty 5

## 2021-09-24 MED ORDER — LEVALBUTEROL HCL 1.25 MG/0.5ML IN NEBU
1.2500 mg | INHALATION_SOLUTION | Freq: Four times a day (QID) | RESPIRATORY_TRACT | Status: DC | PRN
  Administered 2021-09-25: 1.25 mg via RESPIRATORY_TRACT
  Filled 2021-09-24 (×2): qty 0.5

## 2021-09-24 NOTE — Assessment & Plan Note (Signed)
Patient had a CT scan of abdomen and pelvis which showed increased fullness of both adrenal glands with mild periadrenal edema.  Findings could be nonspecific and could indicate adrenal congestion or impending adrenal crisis or adrenal hemorrhage.  A.m. cortisol's levels elevated at 54.7>>41.8 Patient is not hypotensive and does not have hyperkalemia or hyponatremia H&H is also stable Monitor patient closely -We will get benefit from outpatient endocrinology evaluation, after going back to baseline

## 2021-09-24 NOTE — Assessment & Plan Note (Signed)
Stable, repeat TSH at 2.516 -Continue Synthroid

## 2021-09-24 NOTE — Progress Notes (Signed)
Progress Note   Patient: Christina Bullock LZJ:[id number] DOB: 09-19-1955 DOA: 09/22/2021     1 DOS: the patient was seen and examined on 09/24/2021   Brief hospital course: Taken from H&P.  Christina Bullock is a 66 y.o. female with medical history significant for complicated GI surgical history with multiple abdominal surgeries (her current anatomy appears to be a colorectal anastomosis with bowel continuity )hypothyroidism, COPD, depression who presents to the ER for evaluation of a 2-day history of generalized abdominal pain, nausea, vomiting and diarrhea.  She rated her abdominal pain a 6 x 10 in intensity at its worst.  Pain is nonradiating. Patient states that she has been unable to keep any food down everything she eats runs right through her.  She denies any recent antibiotic use.  She denies having any fever, no chills, no recent travel or sick contacts, no chest pain, no shortness of breath, no headache, no dizziness, no lightheadedness, no leg swelling, no blurred vision or focal deficit.  On arrival to ED she was hemodynamically stable.  Labs pertinent for creatinine of 1.14 with baseline less than 1, mild leukocytosis at 14.6. CT abdomen and pelvis shows increased fullness of both adrenal glands with mild peri and renal edema.  This is nonspecific and could indicate adrenal congestion or impending adrenal crisis or adrenal hemorrhage.  Follow-up required. Postsurgical changes of the stomach, small and large bowel with feces filling of a postsurgical right mid abdominal small bowel segment but no significant upstream small bowel dilatation. Mild hepatic steatosis. 2 stable 4 mm left lower lobe nodules. Osteopenia and degenerative change.  A.m. cortisol were elevated at 54.7.  TSH was within normal limit. Patient had pretty much normal EGD and colonoscopy a year ago, biopsies were negative for H. pylori or any malignancy.  5/17: Patient developed some shortness of breath  overnight, chest x-ray obtained which shows patchy opacities in left lung field.  Oxygen requirement increased from 2 to 4 L Diarrhea has been resolved, no bowel movement since in the hospital.  Discontinue enteric precautions.  Patient need outpatient endocrinology follow-up.  5/18: Patient continued to feel unwell.  Having nausea and productive cough.  Remained on 3 to 4 L of oxygen with no baseline oxygen use.   Assessment and Plan: * Gastroenteritis Patient presents for evaluation of diffuse abdominal pain associated with nausea, vomiting and diarrhea.  Diarrhea has been resolved, continue to have some nausea but able to tolerate some diet today. -Supportive care with IV fluid hydration, antiemetics and IV PPI   Shortness of breath Patient developed some shortness of breath and hypoxia overnight.  Chest x-ray at that time with left-sided patchy opacities.  Worsening cough. COVID-19 and influenza PCR negative, respiratory viral panel negative. CT chest concerning for left upper lobe and lingular pneumonia.  Procalcitonin elevated. -Start her on ceftriaxone. -Also received one half dose of Zithromax and developed nausea and vomiting, apparently had intolerance to Zithromax in the past. 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  Findings could be nonspecific and could indicate adrenal congestion or impending adrenal crisis or adrenal hemorrhage.  A.m. cortisol's levels elevated at 54.7>>41.8 Patient is not hypotensive and does not have hyperkalemia or hyponatremia H&H is also stable Monitor patient closely -We will get benefit from outpatient endocrinology evaluation,  after going back to baseline   Subjective: Patient continued  to feel unwell, feeling lethargic, having some nausea but no vomiting.  Continued to have productive cough.  Physical Exam: Vitals:   09/23/21 2326 09/24/21 0323 09/24/21 0755 09/24/21 1534  BP: (!) 111/53 139/80 (!) 146/69 128/63  Pulse: (!) 112 (!) 113 (!) 109 (!) 110  Resp: '20 20 18 18  '$ Temp: 98.3 F (36.8 C) (!) 97.5 F (36.4 C) 98.8 F (37.1 C) 98.8 F (37.1 C)  TempSrc: Oral Oral Oral   SpO2: 90% 98% 91% 90%  Weight:      Height:       General.     In no acute distress. Pulmonary.  Few scattered rhonchi on left, normal respiratory effort. CV.  Regular rate and rhythm, no JVD, rub or murmur. Abdomen.  Soft, nontender, nondistended, BS positive. CNS.  Alert and oriented .  No focal neurologic deficit. Extremities.  No edema, no cyanosis, pulses intact and symmetrical. Psychiatry.  Judgment and insight appears normal.  Data Reviewed: Prior notes, labs and images reviewed  Family Communication:   Disposition: Status is: Inpatient Remains inpatient appropriate because: Severity of illness   Planned Discharge Destination: Home  Time spent: 45 minutes  This record has been created using Systems analyst. Errors have been sought and corrected,but may not always be located. Such creation errors do not reflect on the standard of care.  Author: Lorella Nimrod, MD 09/24/2021 4:30 PM  For on call review [email or web address].

## 2021-09-24 NOTE — Progress Notes (Signed)
Heart rate elevated in the 104 - max 120. Patient with shortness of breath on exertion since admission, and fatigue. NP made aware. Continue to follow patient.

## 2021-09-24 NOTE — Assessment & Plan Note (Signed)
Patient presents for evaluation of diffuse abdominal pain associated with nausea, vomiting and diarrhea.  Diarrhea has been resolved, continue to have some nausea but able to tolerate some diet today. -Supportive care with IV fluid hydration, antiemetics and IV PPI

## 2021-09-25 DIAGNOSIS — K529 Noninfective gastroenteritis and colitis, unspecified: Secondary | ICD-10-CM | POA: Diagnosis not present

## 2021-09-25 DIAGNOSIS — J189 Pneumonia, unspecified organism: Secondary | ICD-10-CM | POA: Diagnosis present

## 2021-09-25 LAB — BASIC METABOLIC PANEL
Anion gap: 8 (ref 5–15)
BUN: 19 mg/dL (ref 8–23)
CO2: 27 mmol/L (ref 22–32)
Calcium: 8.9 mg/dL (ref 8.9–10.3)
Chloride: 99 mmol/L (ref 98–111)
Creatinine, Ser: 1.08 mg/dL — ABNORMAL HIGH (ref 0.44–1.00)
GFR, Estimated: 57 mL/min — ABNORMAL LOW (ref >60)
Glucose, Bld: 106 mg/dL — ABNORMAL HIGH (ref 70–99)
Potassium: 3.6 mmol/L (ref 3.5–5.1)
Sodium: 134 mmol/L — ABNORMAL LOW (ref 135–145)

## 2021-09-25 LAB — PROCALCITONIN: Procalcitonin: 11.53 ng/mL

## 2021-09-25 MED ORDER — CEFPODOXIME PROXETIL 200 MG PO TABS
200.0000 mg | ORAL_TABLET | Freq: Two times a day (BID) | ORAL | Status: DC
  Administered 2021-09-25 – 2021-09-29 (×8): 200 mg via ORAL
  Filled 2021-09-25 (×9): qty 1

## 2021-09-25 MED ORDER — LEVOFLOXACIN 500 MG PO TABS
500.0000 mg | ORAL_TABLET | Freq: Every day | ORAL | Status: DC
  Filled 2021-09-25: qty 1

## 2021-09-25 NOTE — Assessment & Plan Note (Signed)
Patient was found to have left upper lobe and lingular pneumonia.  Continue to require oxygen with no baseline oxygen use.  Cough improving.  Procalcitonin started improving.  Multiple drug allergies versus intolerances. Patient refusing to take Zithromax and doxycycline.  Intolerance to fluoroquinolones. -Antibiotics switched to cefpodoxime. -Continue to monitor

## 2021-09-25 NOTE — Evaluation (Signed)
Physical Therapy Evaluation Patient Details Name: Christina Bullock MRN: [id number] DOB: 21-Jun-1955 Today's Date: 09/25/2021  History of Present Illness  66 y.o. female with medical history significant for complicated GI surgical history with multiple abdominal surgeries (her current anatomy appears to be a colorectal anastomosis with bowel continuity )hypothyroidism, COPD, depression who presents to the ER for evaluation of a 2-day history of generalized abdominal pain, nausea, vomiting and diarrhea.  She rated her abdominal pain a 6 x 10 in intensity at its worst.  Pain is nonradiating.    Clinical Impression  Pt received in supine position and agreeable to therapy.  Pt able to perform bed mobility without much difficulty, however noting some dizziness upon sitting EOB.  Pt given the opportunity to attempt mobility on room air, however SpO2 saturations dropped to 84% sitting at EOB.  Pt placed back on 3 L of O2 and pt was able to achieve 92% within a few seconds of pursed lip breathing.  Pt ambulated into the hallway with some unsteadiness noted and required minA to prevent LOB.  Pt noted dizziness but did not want to request a chair to sit down in.  Pt very slow and seemed as though she was about to experience an episode of syncope, but never did.  Pt transferred to the bed and nursing notified of O2 saturation levels and mobility status.  Current discharge plans to SNF are appropriate at this time.  Pt will continue to benefit from skilled therapy in order to address deficits listed below.      Recommendations for follow up therapy are one component of a multi-disciplinary discharge planning process, led by the attending physician.  Recommendations may be updated based on patient status, additional functional criteria and insurance authorization.  Follow Up Recommendations Skilled nursing-short term rehab (<3 hours/day)    Assistance Recommended at Discharge Frequent or constant  Supervision/Assistance  Patient can return home with the following  A little help with walking and/or transfers;A little help with bathing/dressing/bathroom    Equipment Recommendations Rolling walker (2 wheels)  Recommendations for Other Services       Functional Status Assessment Patient has had a recent decline in their functional status and demonstrates the ability to make significant improvements in function in a reasonable and predictable amount of time.     Precautions / Restrictions Precautions Precautions: Fall Restrictions Weight Bearing Restrictions: No      Mobility  Bed Mobility Overal bed mobility: Needs Assistance Bed Mobility: Supine to Sit, Sit to Supine     Supine to sit: Supervision Sit to supine: Supervision   General bed mobility comments: increased time and cuing for hand placement and technique    Transfers Overall transfer level: Needs assistance Equipment used: 1 person hand held assist Transfers: Bed to chair/wheelchair/BSC, Sit to/from Stand Sit to Stand: Min assist   Step pivot transfers: Min assist       General transfer comment: slowed transfer, but safe.  pt complaining of dizziness upon standing and throughoutrest of session.    Ambulation/Gait Ambulation/Gait assistance: Min guard, Min assist Gait Distance (Feet): 100 Feet Assistive device: Rolling walker (2 wheels) Gait Pattern/deviations: Step-to pattern Gait velocity: decreased     General Gait Details: pt with increasing unsteadiness and demonstrating characteristics of passing out, but continued communicating with therapist.  Stairs            Wheelchair Mobility    Modified Rankin (Stroke Patients Only)       Balance Overall balance assessment: Needs  assistance Sitting-balance support: Feet supported Sitting balance-Leahy Scale: Good     Standing balance support: During functional activity Standing balance-Leahy Scale: Poor                                Pertinent Vitals/Pain Pain Assessment Pain Assessment: No/denies pain    Home Living Family/patient expects to be discharged to:: Private residence Living Arrangements: Alone   Type of Home: House Home Access: Level entry       Home Layout: One level Home Equipment: Rollator (4 wheels);BSC/3in1;Shower seat      Prior Function Prior Level of Function : Independent/Modified Independent             Mobility Comments: Pt is independent without use of AD, driving, and works 20 hrs a week a ADLs Comments: Ind in all aspects of ADLs and IADLs     Hand Dominance   Dominant Hand: Right    Extremity/Trunk Assessment   Upper Extremity Assessment Upper Extremity Assessment: Generalized weakness    Lower Extremity Assessment Lower Extremity Assessment: Generalized weakness       Communication   Communication: No difficulties  Cognition Arousal/Alertness: Awake/alert Behavior During Therapy: WFL for tasks assessed/performed Overall Cognitive Status: Within Functional Limits for tasks assessed                                          General Comments      Exercises     Assessment/Plan    PT Assessment Patient needs continued PT services  PT Problem List Decreased strength;Decreased activity tolerance;Decreased mobility;Decreased safety awareness       PT Treatment Interventions DME instruction;Gait training;Functional mobility training;Therapeutic activities;Therapeutic exercise;Neuromuscular re-education    PT Goals (Current goals can be found in the Care Plan section)  Acute Rehab PT Goals Patient Stated Goal: to get home. PT Goal Formulation: With patient Time For Goal Achievement: 10/09/21 Potential to Achieve Goals: Good    Frequency Min 2X/week     Co-evaluation               AM-PAC PT "6 Clicks" Mobility  Outcome Measure Help needed turning from your back to your side while in a flat bed without using  bedrails?: A Little Help needed moving from lying on your back to sitting on the side of a flat bed without using bedrails?: A Little Help needed moving to and from a bed to a chair (including a wheelchair)?: A Little Help needed standing up from a chair using your arms (e.g., wheelchair or bedside chair)?: A Little Help needed to walk in hospital room?: A Little Help needed climbing 3-5 steps with a railing? : A Lot 6 Click Score: 17    End of Session Equipment Utilized During Treatment: Gait belt   Patient left: in bed;with call bell/phone within reach;with bed alarm set Nurse Communication: Mobility status PT Visit Diagnosis: Unsteadiness on feet (R26.81);Other abnormalities of gait and mobility (R26.89);Muscle weakness (generalized) (M62.81);Difficulty in walking, not elsewhere classified (R26.2)    Time: 3762-8315 PT Time Calculation (min) (ACUTE ONLY): 31 min   Charges:   PT Evaluation $PT Eval Low Complexity: 1 Low PT Treatments $Gait Training: 23-37 mins        Gwenlyn Saran, PT, DPT 09/26/21, 12:05 AM    Christie Nottingham 09/25/2021, 11:40 PM

## 2021-09-25 NOTE — Progress Notes (Signed)
Oxygen Saturation at rest sittiing in the bed on room air 84% Oxygen at rest on 3 liters of O2 90%

## 2021-09-25 NOTE — Assessment & Plan Note (Signed)
Patient had a CT scan of abdomen and pelvis which showed increased fullness of both adrenal glands with mild periadrenal edema.  Findings could be nonspecific and could indicate adrenal congestion or impending adrenal crisis or adrenal hemorrhage.  A.m. cortisol's levels elevated at 54.7>>41.8 Patient is not hypotensive and does not have hyperkalemia or hyponatremia H&H is also stable Monitor patient closely -We will get benefit from outpatient endocrinology evaluation, after going back to baseline

## 2021-09-25 NOTE — TOC Progression Note (Signed)
Transition of Care [facility]) - Progression Note    Patient Details  Name: Christina Bullock MRN: [id number] Date of Birth: November 22, 1955  Transition of Care [facility]) CM/SW [place], RN Phone Number: 09/25/2021, 2:39 PM  Clinical Narrative:     Patient will need Home oxygen, I notified [employer] of the need, will be delivered to the bedside  Expected Discharge Plan: Home/Self Care Barriers to Discharge: Continued Medical Work up  Expected Discharge Plan and Services Expected Discharge Plan: Home/Self Care       Living arrangements for the past 2 months: Single Family Home                                       Social Determinants of Health (SDOH) Interventions    Readmission Risk Interventions     View : No data to display.

## 2021-09-25 NOTE — TOC Progression Note (Signed)
Transition of Care [facility]) - Progression Note    Patient Details  Name: Christina Bullock MRN: [id number] Date of Birth: 05-Apr-1956  Transition of Care [facility]) CM/SW [place], RN Phone Number: 09/25/2021, 12:34 PM  Clinical Narrative:     Patient will likely need Home oxygen, The Oxygen saturation note will need to be input to qualify, Once Oxygen notes are in [facility] will arrange the Home oxygen, Bedside nurse aware       Expected Discharge Plan and Services                                                 Social Determinants of Health (SDOH) Interventions    Readmission Risk Interventions     View : No data to display.

## 2021-09-25 NOTE — Assessment & Plan Note (Signed)
Stable, repeat TSH at 2.516 -Continue Synthroid

## 2021-09-25 NOTE — Evaluation (Signed)
Occupational Therapy Evaluation Patient Details Name: Christina Bullock MRN: [id number] DOB: 02-Feb-1956 Today's Date: 09/25/2021   History of Present Illness 66 y.o. female with medical history significant for complicated GI surgical history with multiple abdominal surgeries (her current anatomy appears to be a colorectal anastomosis with bowel continuity )hypothyroidism, COPD, depression who presents to the ER for evaluation of a 2-day history of generalized abdominal pain, nausea, vomiting and diarrhea.  She rated her abdominal pain a 6 x 10 in intensity at its worst.  Pain is nonradiating.   Clinical Impression   Patient presenting with decreased Ind in self care, balance, functional mobility/transfers, endurance, and safety awareness. Patient reports being independent without use of AD and living alone. 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 Pt is on 3Ls via [place] and remains at or above 90% during session. She is not on O2 at home.  Patient currently functioning at min A and very limited secondary to fatigue. She does not ambulate and only transfers to [facility] for toileting needs. She wants to go home but recommendation for SNF secondary to decreased endurance and increased fall risk.  Patient will benefit from acute OT to increase overall independence in the areas of ADLs, functional mobility, and safety awareness in order to safely discharge to next venue of care.     Recommendations for follow up therapy are one component of a multi-disciplinary discharge planning process, led by the attending physician.  Recommendations may be updated based on patient status, additional functional criteria and insurance authorization.   Follow Up Recommendations  Skilled nursing-short term rehab (<3 hours/day)    Assistance Recommended at Discharge Intermittent Supervision/Assistance  Patient can return home with the following A little help with walking and/or  transfers;A little help with bathing/dressing/bathroom;Help with stairs or ramp for entrance;Assist for transportation;Assistance with cooking/housework    Functional Status Assessment  Patient has had a recent decline in their functional status and demonstrates the ability to make significant improvements in function in a reasonable and predictable amount of time.  Equipment Recommendations  BSC/3in1       Precautions / Restrictions Precautions Precautions: Fall Restrictions Weight Bearing Restrictions: No      Mobility Bed Mobility Overal bed mobility: Needs Assistance Bed Mobility: Supine to Sit, Sit to Supine     Supine to sit: Supervision Sit to supine: Supervision   General bed mobility comments: increased time and cuing for hand placement and technique    Transfers Overall transfer level: Needs assistance Equipment used: 1 person hand held assist Transfers: Bed to chair/wheelchair/BSC, Sit to/from Stand Sit to Stand: Min assist     Step pivot transfers: Min assist            Balance Overall balance assessment: Needs assistance Sitting-balance support: Feet supported Sitting balance-Leahy Scale: Good     Standing balance support: During functional activity Standing balance-Leahy Scale: Poor                             ADL either performed or assessed with clinical judgement   ADL Overall ADL's : Needs assistance/impaired                     Lower Body Dressing: Minimal assistance Lower Body Dressing Details (indicate cue type and reason): for socks Toilet Transfer: Minimal assistance;BSC/3in1   Toileting- Clothing Manipulation and Hygiene: Minimal assistance;Sit to/from  stand       Functional mobility during ADLs: Minimal assistance       Vision Patient Visual Report: No change from baseline              Pertinent Vitals/Pain Pain Assessment Pain Assessment: No/denies pain     Hand Dominance Right    Extremity/Trunk Assessment Upper Extremity Assessment Upper Extremity Assessment: Generalized weakness   Lower Extremity Assessment Lower Extremity Assessment: Generalized weakness       Communication Communication Communication: No difficulties   Cognition Arousal/Alertness: Awake/alert Behavior During Therapy: WFL for tasks assessed/performed Overall Cognitive Status: Within Functional Limits for tasks assessed                                                  Home Living Family/patient expects to be discharged to:: Private residence Living Arrangements: Alone   Type of Home: House Home Access: Level entry     Home Layout: One level     Bathroom Shower/Tub: Occupational psychologist: Standard     Home Equipment: Rollator (4 wheels);BSC/3in1;Shower seat          Prior Functioning/Environment Prior Level of Function : Independent/Modified Independent             Mobility Comments: Pt is independent without use of AD, driving, and works 20 hrs a week a ADLs Comments: Ind in all aspects of ADLs and IADLs        OT Problem List: Decreased strength;Decreased range of motion;Decreased activity tolerance;Impaired balance (sitting and/or standing);Decreased knowledge of use of DME or AE;Decreased safety awareness      OT Treatment/Interventions: Self-care/ADL training;Balance training;Therapeutic exercise;Energy conservation;Therapeutic activities;DME and/or AE instruction;Visual/perceptual remediation/compensation;Manual therapy;Patient/family education    OT Goals(Current goals can be found in the care plan section) Acute Rehab OT Goals Patient Stated Goal: to go home OT Goal Formulation: With patient Time For Goal Achievement: 10/09/21 Potential to Achieve Goals: Fair ADL Goals Pt Will Perform Grooming: standing;with modified independence Pt Will Perform Lower Body Dressing: with modified independence;sit to/from stand Pt Will  Transfer to Toilet: with modified independence;ambulating Pt Will Perform Toileting - Clothing Manipulation and hygiene: with modified independence;sit to/from stand  OT Frequency: Min 2X/week       AM-PAC OT "6 Clicks" Daily Activity     Outcome Measure Help from another person eating meals?: None Help from another person taking care of personal grooming?: None Help from another person toileting, which includes using toliet, bedpan, or urinal?: A Little Help from another person bathing (including washing, rinsing, drying)?: A Little Help from another person to put on and taking off regular upper body clothing?: None Help from another person to put on and taking off regular lower body clothing?: A Little 6 Click Score: 21   End of Session Equipment Utilized During Treatment: Rolling walker (2 wheels) Nurse Communication: Mobility status  Activity Tolerance: Patient limited by fatigue Patient left: in bed;with call bell/phone within reach;with bed alarm set  OT Visit Diagnosis: Unsteadiness on feet (R26.81);Repeated falls (R29.6);Muscle weakness (generalized) (M62.81)                Time: 4332-9518 OT Time Calculation (min): 16 min Charges:  OT General Charges $OT Visit: 1 Visit OT Evaluation $OT Eval Moderate Complexity: 1 Mod OT Treatments $Self Care/Home Management : 8-22 mins  Darleen Crocker,  MS, OTR/L , CBIS ascom [phone]  09/25/21, 3:58 PM

## 2021-09-25 NOTE — Progress Notes (Signed)
Oxygen   According to PT, patient's oxygen saturations dropped down to 84%% on RA while she was sitting on the side of the bed.

## 2021-09-25 NOTE — Assessment & Plan Note (Addendum)
Patient developed some shortness of breath and hypoxia overnight.  Chest x-ray at that time with left-sided patchy opacities.  Worsening cough. COVID-19 and influenza PCR negative, respiratory viral panel negative. CT chest concerning for left upper lobe and lingular pneumonia.  Procalcitonin elevated. -Ceftriaxone switched with cefpodoxime. -Unable to tolerate any atypical coverage at this time -Continue with supplemental oxygen-wean as tolerated -Continue with supportive care

## 2021-09-25 NOTE — Assessment & Plan Note (Signed)
Resolved

## 2021-09-25 NOTE — Progress Notes (Signed)
PHARMACY -  BRIEF ANTIBIOTIC NOTE   Pt was switched from ceftriaxone + azithromycin to Levofloxacin since pt developed N/V with azithromycin. Nurse contacted pharmacy as pt did not want to take levofloxacin. Pt allergy listed to ciprofloxacin was "anxiety". I spoke with the pt to address her concerns and pt informed me the allergy was seizures and not anxiety. I have updated her allergy to ciprofloxacin. I discussed alternative options for treating CAP - Augmentin + doxycycline: pt refused this regimen since Augmentin really upsets her stomach. I offered Cefpodoxime + doxycycline as an alternative. Pt was willing to try Cefpodoxime since she has tolerated Keflex in the past. Pt declined doxycycline since this upsets her stomach as well due to her past medical history/abdominal surgeries. After discussing with patient and MD, we are proceeding with Cefpodoxime. I have informed the pt of the final plan and she is on board.                     Thank you, Forde Dandy Ellis Koffler 09/25/2021  1:55 PM

## 2021-09-25 NOTE — Progress Notes (Signed)
Progress Note   Patient: Christina Bullock DOB: 1955/05/28 DOA: 09/22/2021     2 DOS: the patient was seen and examined on 09/25/2021   Brief hospital course: Taken from H&P.  Christina Bullock is a 66 y.o. female with medical history significant for complicated GI surgical history with multiple abdominal surgeries (her current anatomy appears to be a colorectal anastomosis with bowel continuity )hypothyroidism, COPD, depression who presents to the ER for evaluation of a 2-day history of generalized abdominal pain, nausea, vomiting and diarrhea.  She rated her abdominal pain a 6 x 10 in intensity at its worst.  Pain is nonradiating. Patient states that she has been unable to keep any food down everything she eats runs right through her.  She denies any recent antibiotic use.  She denies having any fever, no chills, no recent travel or sick contacts, no chest pain, no shortness of breath, no headache, no dizziness, no lightheadedness, no leg swelling, no blurred vision or focal deficit.  On arrival to ED she was hemodynamically stable.  Labs pertinent for creatinine of 1.14 with baseline less than 1, mild leukocytosis at 14.6. CT abdomen and pelvis shows increased fullness of both adrenal glands with mild peri and renal edema.  This is nonspecific and could indicate adrenal congestion or impending adrenal crisis or adrenal hemorrhage.  Follow-up required. Postsurgical changes of the stomach, small and large bowel with feces filling of a postsurgical right mid abdominal small bowel segment but no significant upstream small bowel dilatation. Mild hepatic steatosis. 2 stable 4 mm left lower lobe nodules. Osteopenia and degenerative change.  A.m. cortisol were elevated at 54.7.  TSH was within normal limit. Patient had pretty much normal EGD and colonoscopy a year ago, biopsies were negative for H. pylori or any malignancy.  5/17: Patient developed some shortness of breath  overnight, chest x-ray obtained which shows patchy opacities in left lung field.  Oxygen requirement increased from 2 to 4 L Diarrhea has been resolved, no bowel movement since in the hospital.  Discontinue enteric precautions.  Patient need outpatient endocrinology follow-up.  5/18: Patient continued to feel unwell.  Having nausea and productive cough.  Remained on 3 to 4 L of oxygen with no baseline oxygen use.  5/19: Patient overall seems improving, some improvement in cough.  Continue to require up to 3 L of oxygen.  Patient has a lot of allergies and intolerances to medications.  She does not want Zithromax as it makes her nauseated.  She refused to take Augmentin.  She does not want fluoroquinolone as they also make her sick.  She refused to take Doxy.  Eventually agrees to take cefpodoxime for pneumonia. PT/OT evaluation still pending.    Assessment and Plan: * CAP (community acquired pneumonia) Patient was found to have left upper lobe and lingular pneumonia.  Continue to require oxygen with no baseline oxygen use.  Cough improving.  Procalcitonin started improving.  Multiple drug allergies versus intolerances. Patient refusing to take Zithromax and doxycycline.  Intolerance to fluoroquinolones. -Antibiotics switched to cefpodoxime. -Continue to monitor  Shortness of breath Patient developed some shortness of breath and hypoxia overnight.  Chest x-ray at that time with left-sided patchy opacities.  Worsening cough. COVID-19 and influenza PCR negative, respiratory viral panel negative. CT chest concerning for left upper lobe and lingular pneumonia.  Procalcitonin elevated. -Ceftriaxone switched with cefpodoxime. -Unable to tolerate any atypical coverage at this time -Continue with supplemental oxygen-wean as tolerated -Continue with supportive care  Gastroenteritis Resolved.  Hypothyroidism Stable, repeat TSH at 2.516 -Continue Synthroid  Adrenal hemorrhage  (Christina Bullock) Patient had a CT scan of abdomen and pelvis which showed increased fullness of both adrenal glands with mild periadrenal edema.  Findings could be nonspecific and could indicate adrenal congestion or impending adrenal crisis or adrenal hemorrhage.  A.m. cortisol's levels elevated at 54.7>>41.8 Patient is not hypotensive and does not have hyperkalemia or hyponatremia H&H is also stable Monitor patient closely -We will get benefit from outpatient endocrinology evaluation, after going back to baseline   Subjective: Patient was feeling little improved today.  Cough is improving.  Continues to have some lower rib cage pain.  Physical Exam: Vitals:   09/24/21 1933 09/25/21 0024 09/25/21 0351 09/25/21 0801  BP: (!) 146/65 103/88 (!) 141/69 (!) 125/50  Pulse: (!) 114 80 100 81  Resp: '20 20 18 18  '$ Temp: 98.7 F (37.1 C) 98.5 F (36.9 C) 98.4 F (36.9 C) 98.7 F (37.1 C)  TempSrc: Oral Oral Oral   SpO2: 90% (!) 89% 92% 93%  Weight:      Height:       General.  Well-developed lady, in no acute distress. Pulmonary.  Lungs clear bilaterally, normal respiratory effort. CV.  Regular rate and rhythm, no JVD, rub or murmur. Abdomen.  Soft, nontender, nondistended, BS positive. CNS.  Alert and oriented .  No focal neurologic deficit. Extremities.  No edema, no cyanosis, pulses intact and symmetrical. Psychiatry.  Judgment and insight appears normal.  Data Reviewed: Prior notes, labs and images reviewed  Family Communication:   Disposition: Status is: Inpatient Remains inpatient appropriate because:Severity of illness.   Planned Discharge Destination: To be determined  Time spent: 45 minutes  This record has been created using Systems analyst. Errors have been sought and corrected,but may not always be located. Such creation errors do not reflect on the standard of care.  Author: Lorella Nimrod, MD 09/25/2021 2:53 PM  For on call review [email or web address].

## 2021-09-26 DIAGNOSIS — K529 Noninfective gastroenteritis and colitis, unspecified: Secondary | ICD-10-CM | POA: Diagnosis not present

## 2021-09-26 LAB — CBC
HCT: 36.7 % (ref 36.0–46.0)
Hemoglobin: 11.9 g/dL — ABNORMAL LOW (ref 12.0–15.0)
MCH: 29.8 pg (ref 26.0–34.0)
MCHC: 32.4 g/dL (ref 30.0–36.0)
MCV: 91.8 fL (ref 80.0–100.0)
Platelets: 168 10*3/uL (ref 150–400)
RBC: 4 MIL/uL (ref 3.87–5.11)
RDW: 13.2 % (ref 11.5–15.5)
WBC: 6.2 10*3/uL (ref 4.0–10.5)
nRBC: 0 % (ref 0.0–0.2)

## 2021-09-26 LAB — BASIC METABOLIC PANEL
Anion gap: 8 (ref 5–15)
BUN: 16 mg/dL (ref 8–23)
CO2: 28 mmol/L (ref 22–32)
Calcium: 8.9 mg/dL (ref 8.9–10.3)
Chloride: 102 mmol/L (ref 98–111)
Creatinine, Ser: 0.84 mg/dL (ref 0.44–1.00)
GFR, Estimated: >60 mL/min (ref >60)
Glucose, Bld: 92 mg/dL (ref 70–99)
Potassium: 3.3 mmol/L — ABNORMAL LOW (ref 3.5–5.1)
Sodium: 138 mmol/L (ref 135–145)

## 2021-09-26 LAB — MAGNESIUM: Magnesium: 2.2 mg/dL (ref 1.7–2.4)

## 2021-09-26 LAB — PROCALCITONIN: Procalcitonin: 7.07 ng/mL

## 2021-09-26 MED ORDER — SODIUM BICARBONATE 650 MG PO TABS: 650.0000 mg | ORAL_TABLET | Freq: Two times a day (BID) | ORAL | Status: DC

## 2021-09-26 MED ORDER — POTASSIUM CHLORIDE CRYS ER 20 MEQ PO TBCR
40.0000 meq | EXTENDED_RELEASE_TABLET | Freq: Once | ORAL | Status: AC
  Administered 2021-09-26: 40 meq via ORAL
  Filled 2021-09-26: qty 2

## 2021-09-26 NOTE — Progress Notes (Signed)
Progress Note   Patient: Christina Bullock MPN:[id number] DOB: Aug 18, 1955 DOA: 09/22/2021     3 DOS: the patient was seen and examined on 09/26/2021   Brief hospital course: Taken from H&P.  Christina Bullock is a 66 y.o. female with medical history significant for complicated GI surgical history with multiple abdominal surgeries (her current anatomy appears to be a colorectal anastomosis with bowel continuity )hypothyroidism, COPD, depression who presents to the ER for evaluation of a 2-day history of generalized abdominal pain, nausea, vomiting and diarrhea.  She rated her abdominal pain a 6 x 10 in intensity at its worst.  Pain is nonradiating. Patient states that she has been unable to keep any food down everything she eats runs right through her.  She denies any recent antibiotic use.  She denies having any fever, no chills, no recent travel or sick contacts, no chest pain, no shortness of breath, no headache, no dizziness, no lightheadedness, no leg swelling, no blurred vision or focal deficit.  On arrival to ED she was hemodynamically stable.  Labs pertinent for creatinine of 1.14 with baseline less than 1, mild leukocytosis at 14.6. CT abdomen and pelvis shows increased fullness of both adrenal glands with mild peri and renal edema.  This is nonspecific and could indicate adrenal congestion or impending adrenal crisis or adrenal hemorrhage.  Follow-up required. Postsurgical changes of the stomach, small and large bowel with feces filling of a postsurgical right mid abdominal small bowel segment but no significant upstream small bowel dilatation. Mild hepatic steatosis. 2 stable 4 mm left lower lobe nodules. Osteopenia and degenerative change.  A.m. cortisol were elevated at 54.7.  TSH was within normal limit. Patient had pretty much normal EGD and colonoscopy a year ago, biopsies were negative for H. pylori or any malignancy.  5/17: Patient developed some shortness of breath  overnight, chest x-ray obtained which shows patchy opacities in left lung field.  Oxygen requirement increased from 2 to 4 L Diarrhea has been resolved, no bowel movement since in the hospital.  Discontinue enteric precautions.  Patient need outpatient endocrinology follow-up.  5/18: Patient continued to feel unwell.  Having nausea and productive cough.  Remained on 3 to 4 L of oxygen with no baseline oxygen use.  5/19: Patient overall seems improving, some improvement in cough.  Continue to require up to 3 L of oxygen.  Patient has a lot of allergies and intolerances to medications.  She does not want Zithromax as it makes her nauseated.  She refused to take Augmentin.  She does not want fluoroquinolone as they also make her sick.  She refused to take Doxy.  Eventually agrees to take cefpodoxime for pneumonia. PT/OT evaluation still pending.  5/20: Patient continued to improve but still requiring 2 to 3 L of oxygen.  PT and OT are recommending rehab but patient would like to go home with home health services and asking to work with PT again today as she thinks that she might be able to participate more now.    Assessment and Plan: * CAP (community acquired pneumonia) Patient was found to have left upper lobe and lingular pneumonia.  Continue to require oxygen with no baseline oxygen use.  Cough improving.  Procalcitonin started improving.  Multiple drug allergies versus intolerances. Patient refusing to take Zithromax and doxycycline.  Intolerance to fluoroquinolones. -Antibiotics switched to cefpodoxime. -Continue to monitor -Patient will need repeat imaging in 4 to 6 weeks to see the resolution of infiltrates.  Shortness of breath Improving.  Patient developed some shortness of breath and hypoxia. Chest x-ray at that time with left-sided patchy opacities.  Worsening cough. COVID-19 and influenza PCR negative, respiratory viral panel negative. CT chest concerning for left upper lobe and  lingular pneumonia.  Procalcitonin elevated, which is not trending down.  Unable to tolerate Zithromax and multiple antibiotics intolerance.  Ceftriaxone was switched with cefpodoxime -Continue cefpodoxime. -Unable to tolerate any atypical coverage at this time -Continue with supplemental oxygen-wean as tolerated -Continue with supportive care  Gastroenteritis Resolved.     Hypothyroidism Stable, repeat TSH at 2.516 -Continue Synthroid  Adrenal hemorrhage ([place]) Patient had a CT scan of abdomen and pelvis which showed increased fullness of both adrenal glands with mild periadrenal edema.  Findings could be nonspecific and could indicate adrenal congestion or impending adrenal crisis or adrenal hemorrhage.  A.m. cortisol's levels elevated at 54.7>>41.8 Patient is not hypotensive and does not have hyperkalemia or hyponatremia H&H is also stable Monitor patient closely -We will get benefit from outpatient endocrinology evaluation, after going back to baseline   Subjective: Patient was feeling much improved when seen today.  We discussed about PT recommendation going to rehab before returning home.  Per patient she does not want to go to rehab as she is being there twice and did not like that atmosphere.  She wants to work again with PT and would like to go home with home health services.  Patient lives alone but states that my neighbors and friends can take care of me.  Physical Exam: Vitals:   09/25/21 1603 09/25/21 2008 09/26/21 0438 09/26/21 0814  BP: 136/67 (!) 155/65 (!) 148/76 134/62  Pulse: 74 86 73 67  Resp: '18 18 15 18  '$ Temp: 98.3 F (36.8 C) 98.3 F (36.8 C) 98.2 F (36.8 C) 97.7 F (36.5 C)  TempSrc: Oral  Oral Oral  SpO2: 95% 97% 98% 92%  Weight:      Height:       General.  Well-developed lady, in no acute distress. Pulmonary.  Lungs clear bilaterally, normal respiratory effort. CV.  Regular rate and rhythm, no JVD, rub or murmur. Abdomen.  Soft, nontender,  nondistended, BS positive. CNS.  Alert and oriented .  No focal neurologic deficit. Extremities.  No edema, no cyanosis, pulses intact and symmetrical. Psychiatry.  Judgment and insight appears normal.  Data Reviewed: Prior notes and labs reviewed  Family Communication:   Disposition: Status is: Inpatient Remains inpatient appropriate because: Severity of illness   Planned Discharge Destination: Home with Home Health  Time spent: 40 minutes  This record has been created using Systems analyst. Errors have been sought and corrected,but may not always be located. Such creation errors do not reflect on the standard of care.  Author: Lorella Nimrod, MD 09/26/2021 3:00 PM  For on call review [email or web address].

## 2021-09-26 NOTE — Plan of Care (Signed)
  Problem: Clinical Measurements: Goal: Respiratory complications will improve Outcome: Progressing   Problem: Activity: Goal: Risk for activity intolerance will decrease Outcome: Progressing   Problem: Nutrition: Goal: Adequate nutrition will be maintained Outcome: Progressing   Problem: Safety: Goal: Ability to remain free from injury will improve Outcome: Progressing   

## 2021-09-26 NOTE — Assessment & Plan Note (Signed)
Resolved

## 2021-09-26 NOTE — Assessment & Plan Note (Signed)
Improving. Patient developed some shortness of breath and hypoxia. Chest x-ray at that time with left-sided patchy opacities.  Worsening cough. COVID-19 and influenza PCR negative, respiratory viral panel negative. CT chest concerning for left upper lobe and lingular pneumonia.  Procalcitonin elevated, which is not trending down.  Unable to tolerate Zithromax and multiple antibiotics intolerance.  Ceftriaxone was switched with cefpodoxime -Continue cefpodoxime. -Unable to tolerate any atypical coverage at this time -Continue with supplemental oxygen-wean as tolerated -Continue with supportive care

## 2021-09-26 NOTE — Assessment & Plan Note (Signed)
Patient was found to have left upper lobe and lingular pneumonia.  Continue to require oxygen with no baseline oxygen use.  Cough improving.  Procalcitonin started improving.  Multiple drug allergies versus intolerances. Patient refusing to take Zithromax and doxycycline.  Intolerance to fluoroquinolones. -Antibiotics switched to cefpodoxime. -Continue to monitor -Patient will need repeat imaging in 4 to 6 weeks to see the resolution of infiltrates.

## 2021-09-27 ENCOUNTER — Inpatient Hospital Stay: Payer: Medicare HMO

## 2021-09-27 DIAGNOSIS — K529 Noninfective gastroenteritis and colitis, unspecified: Secondary | ICD-10-CM | POA: Diagnosis not present

## 2021-09-27 DIAGNOSIS — R109 Unspecified abdominal pain: Secondary | ICD-10-CM | POA: Diagnosis present

## 2021-09-27 LAB — BASIC METABOLIC PANEL
Anion gap: 6 (ref 5–15)
BUN: 16 mg/dL (ref 8–23)
CO2: 30 mmol/L (ref 22–32)
Calcium: 8.7 mg/dL — ABNORMAL LOW (ref 8.9–10.3)
Chloride: 104 mmol/L (ref 98–111)
Creatinine, Ser: 0.7 mg/dL (ref 0.44–1.00)
GFR, Estimated: >60 mL/min (ref >60)
Glucose, Bld: 89 mg/dL (ref 70–99)
Potassium: 3.5 mmol/L (ref 3.5–5.1)
Sodium: 140 mmol/L (ref 135–145)

## 2021-09-27 MED ORDER — PANTOPRAZOLE SODIUM 40 MG PO TBEC
40.0000 mg | DELAYED_RELEASE_TABLET | Freq: Every day | ORAL | Status: DC
  Administered 2021-09-28: 40 mg via ORAL
  Filled 2021-09-27: qty 1

## 2021-09-27 MED ORDER — BARIUM SULFATE 2 % PO SUSP
450.0000 mL | Freq: Once | ORAL | Status: AC
  Administered 2021-09-27: 450 mL via ORAL

## 2021-09-27 NOTE — Assessment & Plan Note (Signed)
Again developed abdominal pain and unable to tolerate much p.o. intake as all the food worsen her pain.  No nausea or vomiting.  Diarrhea resolved.  Had a regular bowel movement yesterday. -Repeat abdominal imaging. -Continue with supportive care

## 2021-09-27 NOTE — Progress Notes (Signed)
Progress Note   Patient: Christina Bullock YHC:[id number] DOB: 07/29/1955 DOA: 09/22/2021     4 DOS: the patient was seen and examined on 09/27/2021   Brief hospital course: Taken from H&P.  Maelyn Berrey is a 66 y.o. female with medical history significant for complicated GI surgical history with multiple abdominal surgeries (her current anatomy appears to be a colorectal anastomosis with bowel continuity )hypothyroidism, COPD, depression who presents to the ER for evaluation of a 2-day history of generalized abdominal pain, nausea, vomiting and diarrhea.  She rated her abdominal pain a 6 x 10 in intensity at its worst.  Pain is nonradiating. Patient states that she has been unable to keep any food down everything she eats runs right through her.  She denies any recent antibiotic use.  She denies having any fever, no chills, no recent travel or sick contacts, no chest pain, no shortness of breath, no headache, no dizziness, no lightheadedness, no leg swelling, no blurred vision or focal deficit.  On arrival to ED she was hemodynamically stable.  Labs pertinent for creatinine of 1.14 with baseline less than 1, mild leukocytosis at 14.6. CT abdomen and pelvis shows increased fullness of both adrenal glands with mild peri and renal edema.  This is nonspecific and could indicate adrenal congestion or impending adrenal crisis or adrenal hemorrhage.  Follow-up required. Postsurgical changes of the stomach, small and large bowel with feces filling of a postsurgical right mid abdominal small bowel segment but no significant upstream small bowel dilatation. Mild hepatic steatosis. 2 stable 4 mm left lower lobe nodules. Osteopenia and degenerative change.  A.m. cortisol were elevated at 54.7.  TSH was within normal limit. Patient had pretty much normal EGD and colonoscopy a year ago, biopsies were negative for H. pylori or any malignancy.  5/17: Patient developed some shortness of breath  overnight, chest x-ray obtained which shows patchy opacities in left lung field.  Oxygen requirement increased from 2 to 4 L Diarrhea has been resolved, no bowel movement since in the hospital.  Discontinue enteric precautions.  Patient need outpatient endocrinology follow-up.  5/18: Patient continued to feel unwell.  Having nausea and productive cough.  Remained on 3 to 4 L of oxygen with no baseline oxygen use.  5/19: Patient overall seems improving, some improvement in cough.  Continue to require up to 3 L of oxygen.  Patient has a lot of allergies and intolerances to medications.  She does not want Zithromax as it makes her nauseated.  She refused to take Augmentin.  She does not want fluoroquinolone as they also make her sick.  She refused to take Doxy.  Eventually agrees to take cefpodoxime for pneumonia. PT/OT evaluation still pending.  5/20: Patient continued to improve but still requiring 2 to 3 L of oxygen.  PT and OT are recommending rehab but patient would like to go home with home health services and asking to work with PT again today as she thinks that she might be able to participate more now.  5/21: Patient with worsening abdominal pain, had 1 regular bowel movement yesterday.  No nausea or vomiting.  Unable to tolerate any food because of pain. Patient has an history of multiple abdominal surgeries.  Repeat CT abdomen ordered.   Assessment and Plan: * CAP (community acquired pneumonia) Patient was found to have left upper lobe and lingular pneumonia.  Continue to require oxygen with no baseline oxygen use.  Cough improving.  Procalcitonin started improving.  Multiple drug allergies versus intolerances.  Patient refusing to take Zithromax and doxycycline.  Intolerance to fluoroquinolones. -Antibiotics switched to cefpodoxime-To complete a 7-day course -Continue to monitor -Patient will need repeat imaging in 4 to 6 weeks to see the resolution of infiltrates.  Shortness of  breath Improving. Patient developed some shortness of breath and hypoxia. Chest x-ray at that time with left-sided patchy opacities.  Worsening cough. COVID-19 and influenza PCR negative, respiratory viral panel negative. CT chest concerning for left upper lobe and lingular pneumonia.  Procalcitonin elevated, which is not trending down.  Unable to tolerate Zithromax and multiple antibiotics intolerance.  Ceftriaxone was switched with cefpodoxime -Continue cefpodoxime. -Unable to tolerate any atypical coverage at this time -Continue with supplemental oxygen-wean as tolerated -Continue with supportive care  Abdominal pain Again developed abdominal pain and unable to tolerate much p.o. intake as all the food worsen her pain.  No nausea or vomiting.  Diarrhea resolved.  Had a regular bowel movement yesterday. -Repeat abdominal imaging. -Continue with supportive care  Gastroenteritis Resolved.     Hypothyroidism Stable, repeat TSH at 2.516 -Continue Synthroid  Adrenal hemorrhage ([place]) Patient had a CT scan of abdomen and pelvis which showed increased fullness of both adrenal glands with mild periadrenal edema.  Findings could be nonspecific and could indicate adrenal congestion or impending adrenal crisis or adrenal hemorrhage.  A.m. cortisol's levels elevated at 54.7>>41.8 Patient is not hypotensive and does not have hyperkalemia or hyponatremia H&H is also stable Monitor patient closely -We will get benefit from outpatient endocrinology evaluation, after going back to baseline   Subjective: Patient was complaining of worsening abdominal pain, unable to tolerate much food, no nausea or vomiting.  Physical Exam: Vitals:   09/26/21 1524 09/26/21 2006 09/27/21 0516 09/27/21 0818  BP: 135/82 (!) 168/95 (!) 159/71 (!) 154/70  Pulse: 71 93 86 73  Resp: '18 20 18 20  '$ Temp: 98.7 F (37.1 C) 98.3 F (36.8 C) 98.9 F (37.2 C) 98.6 F (37 C)  TempSrc: Oral   Oral  SpO2: 96% 93% 95% 95%   Weight:      Height:       General.  Ill-appearing lady, in no acute distress. Pulmonary.  Lungs clear bilaterally, normal respiratory effort. CV.  Regular rate and rhythm, no JVD, rub or murmur. Abdomen.  Soft, mild diffuse tenderness, nondistended, BS positive. CNS.  Alert and oriented .  No focal neurologic deficit. Extremities.  Trace LE edema, no cyanosis, pulses intact and symmetrical. Psychiatry.  Judgment and insight appears normal.  Data Reviewed: Prior notes and labs reviewed  Family Communication:   Disposition: Status is: Inpatient Remains inpatient appropriate because: Severity of illness   Planned Discharge Destination: Skilled nursing facility  Time spent: 45 minutes  This record has been created using Systems analyst. Errors have been sought and corrected,but may not always be located. Such creation errors do not reflect on the standard of care.  Author: Lorella Nimrod, MD 09/27/2021 12:56 PM  For on call review [email or web address].

## 2021-09-27 NOTE — Assessment & Plan Note (Signed)
Patient was found to have left upper lobe and lingular pneumonia.  Continue to require oxygen with no baseline oxygen use.  Cough improving.  Procalcitonin started improving.  Multiple drug allergies versus intolerances. Patient refusing to take Zithromax and doxycycline.  Intolerance to fluoroquinolones. -Antibiotics switched to cefpodoxime-To complete a 7-day course -Continue to monitor -Patient will need repeat imaging in 4 to 6 weeks to see the resolution of infiltrates.

## 2021-09-27 NOTE — Plan of Care (Signed)
  Problem: Clinical Measurements: Goal: Respiratory complications will improve Outcome: Progressing   Problem: Nutrition: Goal: Adequate nutrition will be maintained Outcome: Progressing   Problem: Safety: Goal: Ability to remain free from injury will improve Outcome: Progressing   

## 2021-09-27 NOTE — Assessment & Plan Note (Signed)
Resolved

## 2021-09-28 DIAGNOSIS — K529 Noninfective gastroenteritis and colitis, unspecified: Secondary | ICD-10-CM | POA: Diagnosis not present

## 2021-09-28 DIAGNOSIS — R112 Nausea with vomiting, unspecified: Secondary | ICD-10-CM | POA: Diagnosis not present

## 2021-09-28 DIAGNOSIS — R1010 Upper abdominal pain, unspecified: Secondary | ICD-10-CM | POA: Diagnosis not present

## 2021-09-28 MED ORDER — PANCRELIPASE (LIP-PROT-AMYL) 12000-38000 UNITS PO CPEP
72000.0000 [IU] | ORAL_CAPSULE | Freq: Three times a day (TID) | ORAL | Status: DC
  Filled 2021-09-28: qty 6

## 2021-09-28 MED ORDER — PANTOPRAZOLE SODIUM 40 MG PO TBEC
40.0000 mg | DELAYED_RELEASE_TABLET | Freq: Two times a day (BID) | ORAL | Status: DC
Start: 2021-09-28 — End: 2021-09-29
  Administered 2021-09-29: 40 mg via ORAL
  Filled 2021-09-28 (×2): qty 1

## 2021-09-28 NOTE — Assessment & Plan Note (Signed)
Again developed abdominal pain and unable to tolerate much p.o. intake as all the food worsen her pain.  No nausea or vomiting.  Diarrhea resolved.  Had a regular bowel movements.  Repeat CT abdomen was without any significant abnormality. Might need CTA to rule out mesenteric ischemia. -GI was consulted -Continue with supportive care

## 2021-09-28 NOTE — Care Management Important Message (Signed)
Important Message  Patient Details  Name: Christina Bullock MRN: [id number] Date of Birth: 08/05/1955   Medicare Important Message Given:  Yes     [name] 09/28/2021, 11:46 AM

## 2021-09-28 NOTE — TOC Progression Note (Signed)
Transition of Care [facility]) - Progression Note    Patient Details  Name: Christina Bullock MRN: [id number] Date of Birth: April 03, 1956  Transition of Care [employer]) CM/SW Contact  Beverly Sessions, RN Phone Number: 09/28/2021, 4:47 PM  Clinical Narrative:     Portable O2 delivered to room by [employer].  Therapy upgraded recommendations to home health  Patient agreeable to home health services  Patient states that she does not have a preference of home health agency.  Referral made and accepted by cory with [facility].   Patient states that she will arrange an uber at discharge   Expected Discharge Plan: [place] Barriers to Discharge: Continued Medical Work up  Expected Discharge Plan and Services Expected Discharge Plan: [place] arrangements for the past 2 months: Single Family Home                           HH Arranged: RN, PT, OT [facility] Agency: [place]: 09/28/21   Representative spoke with at [place]: [place] (SDOH) Interventions    Readmission Risk Interventions     View : No data to display.

## 2021-09-28 NOTE — Progress Notes (Signed)
Mobility Specialist - Progress Note   09/28/21 1500  Mobility  Activity Ambulated with assistance in hallway  Level of Assistance Standby assist, set-up cues, supervision of patient - no hands on  Assistive Device Front wheel walker  Distance Ambulated (ft) 100 ft  Activity Response Tolerated well  $Mobility charge 1 Mobility     Pre-mobility: 103 HR, 93% SpO2 During mobility: 102 HR, 89% SpO2 Post-mobility: 101 HR, 91% SPO2   Pt lying in bed upon arrival, utilizing 4L. Pt voiced 6/10 abdominal pain, nausea, and dizziness throughout session. Pt transferred to [facility] for urinal out prior to continuation in hallway. MinG for ambulation with occasional CGA during turns. VC to keep RW within BOS. Several short rest breaks to manage pain and SOB. Pt left EOB upon OT arrival. RN notified.   Kathee Delton Mobility Specialist 09/28/21, 3:14 PM

## 2021-09-28 NOTE — Progress Notes (Signed)
Occupational Therapy Treatment Patient Details Name: Christina Bullock MRN: [id number] DOB: December 25, 1955 Today's Date: 09/28/2021   History of present illness 66 y.o. female with medical history significant for complicated GI surgical history with multiple abdominal surgeries (her current anatomy appears to be a colorectal anastomosis with bowel continuity )hypothyroidism, COPD, depression who presents to the ER for evaluation of a 2-day history of generalized abdominal pain, nausea, vomiting and diarrhea.  She rated her abdominal pain a 6 x 10 in intensity at its worst.  Pain is nonradiating.   OT comments  Upon entering the room, pt returning after ambulation with mobility tech. Pt O2 saturation is at 89% on 4Ls via [place]. Pt encouraged to perform pursed lip breathing and increasing it to 93%. OT educates pt further on use of flutter valve and incentive spirometer x 5 reps each. Pt begins to feel nauseated and is given emesis bag. No active vomiting this session but pt feels unwell. Pt stands with supervision and takes side steps along EOB closer to [facility] before returning to supine with min cuing for technique but no physical assistance. Pt remains in bed with all needs within reach. Pt continues to benefit from OT intervention.    Recommendations for follow up therapy are one component of a multi-disciplinary discharge planning process, led by the attending physician.  Recommendations may be updated based on patient status, additional functional criteria and insurance authorization.    Follow Up Recommendations  Skilled nursing-short term rehab (<3 hours/day)    Assistance Recommended at Discharge Intermittent Supervision/Assistance  Patient can return home with the following  A little help with walking and/or transfers;A little help with bathing/dressing/bathroom;Help with stairs or ramp for entrance;Assist for transportation;Assistance with cooking/housework   Equipment Recommendations   BSC/3in1       Precautions / Restrictions Precautions Precautions: Fall Restrictions Weight Bearing Restrictions: No       Mobility Bed Mobility Overal bed mobility: Needs Assistance Bed Mobility: Supine to Sit, Sit to Supine     Supine to sit: Supervision Sit to supine: Supervision   General bed mobility comments: increased time and cuing for technique    Transfers Overall transfer level: Needs assistance Equipment used: 1 person hand held assist Transfers: Sit to/from Stand Sit to Stand: Supervision                 Balance Overall balance assessment: Needs assistance Sitting-balance support: Feet supported Sitting balance-Leahy Scale: Good     Standing balance support: During functional activity Standing balance-Leahy Scale: Fair                             ADL either performed or assessed with clinical judgement    Extremity/Trunk Assessment Upper Extremity Assessment Upper Extremity Assessment: Generalized weakness   Lower Extremity Assessment Lower Extremity Assessment: Generalized weakness        Vision Patient Visual Report: No change from baseline            Cognition Arousal/Alertness: Awake/alert Behavior During Therapy: WFL for tasks assessed/performed Overall Cognitive Status: Within Functional Limits for tasks assessed                                                     Pertinent Vitals/ Pain       Pain Assessment Pain  Assessment: No/denies pain         Frequency  Min 2X/week        Progress Toward Goals  OT Goals(current goals can now be found in the care plan section)  Progress towards OT goals: Progressing toward goals  Acute Rehab OT Goals Patient Stated Goal: to go home OT Goal Formulation: With patient Time For Goal Achievement: 10/09/21 Potential to Achieve Goals: Advance Discharge plan remains appropriate;Frequency remains appropriate       AM-PAC OT "6 Clicks" Daily  Activity     Outcome Measure   Help from another person eating meals?: None Help from another person taking care of personal grooming?: None Help from another person toileting, which includes using toliet, bedpan, or urinal?: A Little Help from another person bathing (including washing, rinsing, drying)?: A Little Help from another person to put on and taking off regular upper body clothing?: None Help from another person to put on and taking off regular lower body clothing?: A Little 6 Click Score: 21    End of Session Equipment Utilized During Treatment: Rolling walker (2 wheels)  OT Visit Diagnosis: Unsteadiness on feet (R26.81);Repeated falls (R29.6);Muscle weakness (generalized) (M62.81)   Activity Tolerance Patient limited by fatigue   Patient Left in bed;with call bell/phone within reach;with bed alarm set   Nurse Communication Mobility status        Time: 8768-1157 OT Time Calculation (min): 15 min  Charges: OT General Charges $OT Visit: 1 Visit OT Treatments $Therapeutic Activity: 8-22 mins Darleen Crocker, MS, OTR/L , CBIS ascom [phone]  09/28/21, 3:28 PM

## 2021-09-28 NOTE — Progress Notes (Signed)
Patient is unsatisfied with her care.  The patient feels that she is not getting the full medical treatment she should be getting.   Patient also does not understand why somethings are done the way they are. I had to explain to her that a radiologist read the CT results and the Dr see's the results too. She felt that a g.I. Dr should have been involved in the CT reading due to her medical history. She felt she was not receiving enough antibiotics and I explained to her that antibiotics are given at certain time such as the rocephin she received was ordered daily and explained why antibiotics couldn't be given continous. She was also upset that we don't have an endocrinologist to come to the hospital . She is wanting to go home but she is still "nauseated and her bowels hurt" after eating lunch. Dr Reesa Chew notified and Dr Marius Ditch with [facility] was consulted and did come and see the patient. Patient is eager to discharge. MD is aware

## 2021-09-28 NOTE — Assessment & Plan Note (Signed)
Resolved

## 2021-09-28 NOTE — Consult Note (Signed)
Christina Darby, MD [address]  [place]  [place], [place] [address]  Main: [phone]  Fax: [phone] Pager: [phone]   Consultation  Referring Provider:     No ref. provider found Primary Care Physician:  Romualdo Bolk, FNP Primary Gastroenterologist:  Dr. Haig Prophet         Reason for Consultation:     Central abdominal pain, postprandial diarrhea, abdominal bloating  Date of Admission:  09/22/2021 Date of Consultation:  09/28/2021         HPI:   Christina Bullock is a 66 y.o. female with history of left hemicolectomy, hypothyroidism, obesity, history of COPD, chronic tobacco use presented to ER on 09/22/2021 secondary to abdominal pain, severe cramps associated with postprandial nonbloody watery diarrhea and abdominal gas and bloating.  She states that her abdominal pain is central and across the belly, bowel movements are anywhere from 10 to 12/day on bad days and lately most of the days in a week have been bad.  Patient reports that she is able to tolerate clear liquids only.  Patient is also found to have left lung pneumonia and increased oxygen requirement, currently oxygenating well on 4 L oxygen.  Patient reports that she has been experiencing GI symptoms on and off for several months, have worsened lately.  Patient states that she has an appointment to see Dr. Haig Prophet for Cologuard positive test on 5/24, although I am not able to see any scheduled appointment with him on her chart.  Patient denies any consumption of carbonated beverages, artificial sweeteners, sweet tea.  She reports that fatty foods aggravate her GI symptoms. She does continue to smoke tobacco, half pack per day, almost all her life Patient underwent upper endoscopy and colonoscopy in 2/22 which were fairly unremarkable including random colon biopsies, gastric and esophageal biopsies  NSAIDs: None  Antiplts/Anticoagulants/Anti thrombotics: None  GI Procedures:  EGD and colonoscopy  06/2020 DIAGNOSIS:  A. STOMACH; COLD BIOPSY:  - GASTRIC ANTRAL AND OXYNTIC MUCOSA WITH FOCAL MINIMAL CHRONIC INACTIVE  GASTRITIS; OTHERWISE NO SIGNIFICANT PATHOLOGIC CHANGE.  - NEGATIVE FOR H. PYLORI, DYSPLASIA, AND MALIGNANCY.   B. ESOPHAGUS, DISTAL; COLD BIOPSY:  - BENIGN SQUAMOUS MUCOSA WITH FEATURES OF MILD REFLUX ESOPHAGITIS.  - NO SIGNIFICANT INCREASE IN INTRAEPITHELIAL EOSINOPHILS (UP TO 3 IN AN  HPF).  - NEGATIVE FOR DYSPLASIA AND MALIGNANCY.   C. ESOPHAGUS, PROXIMAL; COLD BIOPSY:  - BENIGN SQUAMOUS MUCOSA WITH FEATURES OF MILD REFLUX ESOPHAGITIS.  - MILD INCREASE IN INTRAEPITHELIAL EOSINOPHILS (UP TO 7 IN AN HPF).  - NEGATIVE FOR DYSPLASIA AND MALIGNANCY.   D. COLON; COLD BIOPSY:  - BENIGN COLONIC MUCOSA WITH NO SIGNIFICANT HISTOPATHOLOGIC CHANGE.  - NEGATIVE FOR FEATURES OF MICROSCOPIC COLITIS.  - NEGATIVE FOR DYSPLASIA AND MALIGNANCY.   Past Medical History:  Diagnosis Date   Anxiety    Cancer ([place])    endometrial   Depression    Gastritis    Hypothyroidism    Mitral prolapse    Pulmonary emphysema ([address])    Thyroid disease     Past Surgical History:  Procedure Laterality Date   BLADDER REMOVAL     COLON SURGERY     COLONOSCOPY     COLONOSCOPY WITH PROPOFOL N/A 06/10/2020   Procedure: COLONOSCOPY WITH PROPOFOL;  Surgeon: Lesly Rubenstein, MD;  Location: [facility] ENDOSCOPY;  Service: Endoscopy;  Laterality: N/A;   COLOSTOMY     COLOSTOMY REVERSAL     ESOPHAGOGASTRODUODENOSCOPY (EGD) WITH PROPOFOL N/A 06/10/2020  Procedure: ESOPHAGOGASTRODUODENOSCOPY (EGD) WITH PROPOFOL;  Surgeon: Lesly Rubenstein, MD;  Location: [facility] ENDOSCOPY;  Service: Endoscopy;  Laterality: N/A;   ILEOSTOMY     THYROID SURGERY     UPPER GI ENDOSCOPY      Current Facility-Administered Medications:    0.9 %  sodium chloride infusion, 250 mL, Intravenous, PRN, Agbata, Tochukwu, MD   cefpodoxime (VANTIN) tablet 200 mg, 200 mg, Oral, Q12H, Lorella Nimrod, MD, 200 mg at 09/28/21 2229    cholecalciferol (VITAMIN D3) tablet 1,000 Units, 1,000 Units, Oral, Daily, Agbata, Tochukwu, MD, 1,000 Units at 09/28/21 0823   levalbuterol (XOPENEX) nebulizer solution 1.25 mg, 1.25 mg, Nebulization, Q6H PRN, Lorella Nimrod, MD, 1.25 mg at 09/25/21 0745   levothyroxine (SYNTHROID) tablet 100 mcg, 100 mcg, Oral, Once per day on Mon Tue Wed Thu Fri, Agbata, Tochukwu, MD, 100 mcg at 09/28/21 0544   levothyroxine (SYNTHROID) tablet 50 mcg, 50 mcg, Oral, Once per day on Sun Sat, Agbata, Tochukwu, MD, 50 mcg at 09/27/21 0517   [START ON 09/29/2021] lipase/protease/amylase (CREON) capsule 72,000 Units, 72,000 Units, Oral, TID WC, Faustina Gebert, Tally Due, MD   MEDLINE mouth rinse, 15 mL, Mouth Rinse, BID, Agbata, Tochukwu, MD, 15 mL at 09/28/21 0826   metoCLOPramide (REGLAN) injection 10 mg, 10 mg, Intravenous, Once, Agbata, Tochukwu, MD   morphine (PF) 2 MG/ML injection 1 mg, 1 mg, Intravenous, Q4H PRN, Agbata, Tochukwu, MD, 1 mg at 09/27/21 1044   ondansetron (ZOFRAN) tablet 4 mg, 4 mg, Oral, Q6H PRN, 4 mg at 09/25/21 1450 **OR** ondansetron (ZOFRAN) injection 4 mg, 4 mg, Intravenous, Q6H PRN, Agbata, Tochukwu, MD, 4 mg at 09/24/21 1215   pantoprazole (PROTONIX) EC tablet 40 mg, 40 mg, Oral, BID AC, Rilynn Habel, Tally Due, MD   sodium chloride flush (NS) 0.9 % injection 3 mL, 3 mL, Intravenous, Q12H, Agbata, Tochukwu, MD, 3 mL at 09/28/21 0825   sodium chloride flush (NS) 0.9 % injection 3 mL, 3 mL, Intravenous, PRN, Agbata, Tochukwu, MD    Family History  Adopted: Yes  Problem Relation Age of Onset   Breast cancer Neg Hx      Social History   Tobacco Use   Smoking status: Every Day    Packs/day: 1.00    Years: 50.00    Pack years: 50.00    Types: Cigarettes   Smokeless tobacco: Never  Vaping Use   Vaping Use: Never used  Substance Use Topics   Alcohol use: No   Drug use: No    Allergies as of 09/22/2021 - Review Complete 09/22/2021  Allergen Reaction Noted   Iohexol Anaphylaxis 05/26/2015    Phenylephrine Anaphylaxis 05/26/2015   Albuterol  04/19/2019   Albuterol-ipratropium-soybean lecithin [ipratropium-albuterol] Other (See Comments) 05/26/2015   Benadryl [diphenhydramine] Other (See Comments) 05/26/2015   Codeine Nausea Only 05/26/2015   Contrast media [iodinated contrast media]  04/19/2019   Effexor [venlafaxine] Other (See Comments) 05/26/2015   Erythromycin Nausea Only 05/26/2015   Kenalog [triamcinolone]  04/19/2019   Nitrofurantoin macrocrystal  04/19/2019   Other  05/26/2015   Sulfa antibiotics Other (See Comments) 05/26/2015   Sulfasalazine  04/19/2019   Ciprofloxacin Anxiety 05/26/2015   Demerol [meperidine] Anxiety 05/26/2015   Floxin [ofloxacin] Anxiety 05/26/2015   Stadol [butorphanol] Anxiety 05/26/2015    Review of Systems:    All systems reviewed and negative except where noted in HPI.   Physical Exam:  Vital signs in last 24 hours: Temp:  [98 F (36.7 C)-98.9 F (37.2 C)] 98 F (36.7 C) (05/22  1528) Pulse Rate:  [69-74] 69 (05/22 1528) Resp:  [18-20] 18 (05/22 1528) BP: (122-145)/(65-79) 132/79 (05/22 1528) SpO2:  [92 %-93 %] 92 % (05/22 1528) Last BM Date : 09/27/21 General:   Pleasant, cooperative in NAD Head:  Normocephalic and atraumatic. Eyes:   No icterus.   Conjunctiva pink. PERRLA. Ears:  Normal auditory acuity. Neck:  Supple; no masses or thyroidomegaly Lungs: Respirations even and unlabored. Lungs clear to auscultation bilaterally.   No wheezes, crackles, or rhonchi.  Heart:  Regular rate and rhythm;  Without murmur, clicks, rubs or gallops Abdomen:  Soft, obese, nondistended, nontender. Normal bowel sounds. No appreciable masses or hepatomegaly.  No rebound or guarding.  Rectal:  Not performed. Msk:  Symmetrical without gross deformities.  Strength generalized weakness Extremities:  Without edema, cyanosis or clubbing. Neurologic:  Alert and oriented x3;  grossly normal neurologically. Skin:  Intact without significant lesions  or rashes. Psych:  Alert and cooperative. Normal affect.  LAB RESULTS:    Latest Ref Rng & Units 09/26/2021    4:53 AM 09/24/2021    5:20 AM 09/23/2021    5:29 AM  CBC  WBC 4.0 - 10.5 K/uL 6.2   7.4   10.8    Hemoglobin 12.0 - 15.0 g/dL 11.9   12.6   13.6    Hematocrit 36.0 - 46.0 % 36.7   39.0   43.0    Platelets 150 - 400 K/uL 168   151   166      BMET    Latest Ref Rng & Units 09/27/2021    5:06 AM 09/26/2021    4:53 AM 09/25/2021    4:18 AM  BMP  Glucose 70 - 99 mg/dL 89   92   106    BUN 8 - 23 mg/dL $Remove'16   16   19    'iOWBHbq$ Creatinine 0.44 - 1.00 mg/dL 0.70   0.84   1.08    Sodium 135 - 145 mmol/L 140   138   134    Potassium 3.5 - 5.1 mmol/L 3.5   3.3   3.6    Chloride 98 - 111 mmol/L 104   102   99    CO2 22 - 32 mmol/L $RemoveB'30   28   27    'DiDFybSK$ Calcium 8.9 - 10.3 mg/dL 8.7   8.9   8.9      LFT    Latest Ref Rng & Units 09/22/2021   12:23 AM 04/19/2019    1:44 PM 05/26/2015    3:17 PM  Hepatic Function  Total Protein 6.5 - 8.1 g/dL 7.2   7.7   7.7    Albumin 3.5 - 5.0 g/dL 4.2   4.3   4.5    AST 15 - 41 U/L 31   27   34    ALT 0 - 44 U/L $Remo'19   18   24    'RULrl$ Alk Phosphatase 38 - 126 U/L 63   60   71    Total Bilirubin 0.3 - 1.2 mg/dL 1.0   1.0   0.6       STUDIES: CT ABDOMEN PELVIS WO CONTRAST  Result Date: 09/27/2021 CLINICAL DATA:  66 year old female with acute abdominal pain. EXAM: CT ABDOMEN AND PELVIS WITHOUT CONTRAST TECHNIQUE: Multidetector CT imaging of the abdomen and pelvis was performed following the standard protocol without IV contrast. RADIATION DOSE REDUCTION: This exam was performed according to the departmental dose-optimization program which includes automated exposure control, adjustment  of the mA and/or kV according to patient size and/or use of iterative reconstruction technique. COMPARISON:  09/23/2021 CT and prior studies FINDINGS: Please note that parenchymal and vascular abnormalities may be missed as intravenous contrast was not administered. Lower chest: Partial  visualization of lingular consolidation again noted. A small to moderate LEFT pleural effusion is again identified. Hepatobiliary: Hepatic steatosis identified without focal hepatic abnormality. The gallbladder is unremarkable. There is no evidence of intrahepatic or extrahepatic biliary dilatation. Pancreas: Unremarkable Spleen: Unremarkable Adrenals/Urinary Tract: The kidneys, adrenal glands and bladder are unremarkable except for mild bilateral adrenal hypertrophy. Stomach/Bowel: Surgical changes along the stomach again noted. There is no evidence of bowel obstruction or pneumoperitoneum. No definite bowel wall thickening is present. No inflammatory changes are identified. Vascular/Lymphatic: Aortic atherosclerosis. No enlarged abdominal or pelvic lymph nodes. Reproductive: Status post hysterectomy. No adnexal masses. Other: No ascites, focal collection or pneumoperitoneum. Musculoskeletal: No acute or suspicious bony abnormalities are noted. Severe degenerative disc disease and grade 1 spondylolisthesis at L5-S1 again noted. IMPRESSION: 1. No evidence of acute abnormality within the abdomen or pelvis. 2. Lingular consolidation/pneumonia with small to moderate LEFT pleural effusion again noted. 3. Hepatic steatosis. 4. Aortic Atherosclerosis (ICD10-I70.0). Electronically Signed   By: Margarette Canada M.D.   On: 09/27/2021 15:50      Impression / Plan:   Hadleigh Felber is a 66 y.o. 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, chronic tobacco use, history of COPD is admitted with progressively worsening central abdominal pain, cramps, worse postprandial, severe postprandial nonbloody watery diarrhea associated with abdominal bloating and gas, aggravated by fatty foods  Recommend GI profile PCR to rule out any infectious etiology Check celiac disease panel Recommend pancreatic fecal elastase levels given patient's history of chronic tobacco use, rule  out exocrine pancreatic insufficiency as patient is at risk for chronic pancreatitis Empiric trial of Creon 72 K with each meal and 1 with snack Empiric trial of Protonix 40 mg p.o. twice daily before meals Patient already had upper endoscopy and colonoscopy in 06/2020.  Patient is currently being treated for pneumonia and on 4 L oxygen, high risk for anesthesia in order to undergo repeat endoscopic evaluation Given multiple GI surgeries, small intestinal bacterial overgrowth is also on differential, can empirically treat with Xifaxan 550 mg 3 times daily for 2 weeks if above work-up is negative Close follow-up with patient's primary GI, Dr. Haig Prophet as outpatient  Thank you for involving me in the care of this patient.      LOS: 5 days   Sherri Sear, MD  09/28/2021, 6:41 PM    Note: This dictation was prepared with Dragon dictation along with smaller phrase technology. Any transcriptional errors that result from this process are unintentional.

## 2021-09-28 NOTE — Progress Notes (Signed)
Progress Note   Patient: Christina Bullock WER:[id number] DOB: 1956/03/16 DOA: 09/22/2021     5 DOS: the patient was seen and examined on 09/28/2021   Brief hospital course: Taken from H&P.  Christina Bullock is a 66 y.o. female with medical history significant for complicated GI surgical history with multiple abdominal surgeries (her current anatomy appears to be a colorectal anastomosis with bowel continuity )hypothyroidism, COPD, depression who presents to the ER for evaluation of a 2-day history of generalized abdominal pain, nausea, vomiting and diarrhea.  She rated her abdominal pain a 6 x 10 in intensity at its worst.  Pain is nonradiating. Patient states that she has been unable to keep any food down everything she eats runs right through her.  She denies any recent antibiotic use.  She denies having any fever, no chills, no recent travel or sick contacts, no chest pain, no shortness of breath, no headache, no dizziness, no lightheadedness, no leg swelling, no blurred vision or focal deficit.  On arrival to ED she was hemodynamically stable.  Labs pertinent for creatinine of 1.14 with baseline less than 1, mild leukocytosis at 14.6. CT abdomen and pelvis shows increased fullness of both adrenal glands with mild peri and renal edema.  This is nonspecific and could indicate adrenal congestion or impending adrenal crisis or adrenal hemorrhage.  Follow-up required. Postsurgical changes of the stomach, small and large bowel with feces filling of a postsurgical right mid abdominal small bowel segment but no significant upstream small bowel dilatation. Mild hepatic steatosis. 2 stable 4 mm left lower lobe nodules. Osteopenia and degenerative change.  A.m. cortisol were elevated at 54.7.  TSH was within normal limit. Patient had pretty much normal EGD and colonoscopy a year ago, biopsies were negative for H. pylori or any malignancy.  5/17: Patient developed some shortness of breath  overnight, chest x-ray obtained which shows patchy opacities in left lung field.  Oxygen requirement increased from 2 to 4 L Diarrhea has been resolved, no bowel movement since in the hospital.  Discontinue enteric precautions.  Patient need outpatient endocrinology follow-up.  5/18: Patient continued to feel unwell.  Having nausea and productive cough.  Remained on 3 to 4 L of oxygen with no baseline oxygen use.  5/19: Patient overall seems improving, some improvement in cough.  Continue to require up to 3 L of oxygen.  Patient has a lot of allergies and intolerances to medications.  She does not want Zithromax as it makes her nauseated.  She refused to take Augmentin.  She does not want fluoroquinolone as they also make her sick.  She refused to take Doxy.  Eventually agrees to take cefpodoxime for pneumonia. PT/OT evaluation still pending.  5/20: Patient continued to improve but still requiring 2 to 3 L of oxygen.  PT and OT are recommending rehab but patient would like to go home with home health services and asking to work with PT again today as she thinks that she might be able to participate more now.  5/21: Patient with worsening abdominal pain, had 1 regular bowel movement yesterday.  No nausea or vomiting.  Unable to tolerate any food because of pain. Patient has an history of multiple abdominal surgeries.  Repeat CT abdomen ordered.  5/22: Repeat CT abdomen was without any acute abnormality.  Prior findings of lingular pneumonia with mild left pleural effusion was noted. Patient continued to experience abdominal pain after eating.  No nausea or vomiting.  She seems very frustrated.  GI was  consulted.  No significant atherosclerosis noted on CT abdomen but might need CTA abdomen and pelvis to rule out any mesenteric ischemia. OT recommendations remained for SNF, PT now recommending home health services.  Patient wants to go home with home health.   Assessment and Plan: * CAP (community  acquired pneumonia) Patient was found to have left upper lobe and lingular pneumonia.  Continue to require oxygen with no baseline oxygen use.  Cough improving.  Procalcitonin started improving.  Multiple drug allergies versus intolerances. Patient refusing to take Zithromax and doxycycline.  Intolerance to fluoroquinolones. -Antibiotics switched to cefpodoxime-To complete a 7-day course -Continue to monitor -Patient will need repeat imaging in 4 to 6 weeks to see the resolution of infiltrates.  Shortness of breath Improving. Patient developed some shortness of breath and hypoxia. Chest x-ray at that time with left-sided patchy opacities.  Worsening cough. COVID-19 and influenza PCR negative, respiratory viral panel negative. CT chest concerning for left upper lobe and lingular pneumonia.  Procalcitonin elevated, which is not trending down.  Unable to tolerate Zithromax and multiple antibiotics intolerance.  Ceftriaxone was switched with cefpodoxime -Continue cefpodoxime. -Unable to tolerate any atypical coverage at this time -Continue with supplemental oxygen-wean as tolerated -Continue with supportive care  Abdominal pain Again developed abdominal pain and unable to tolerate much p.o. intake as all the food worsen her pain.  No nausea or vomiting.  Diarrhea resolved.  Had a regular bowel movements.  Repeat CT abdomen was without any significant abnormality. Might need CTA to rule out mesenteric ischemia. -GI was consulted -Continue with supportive care  Gastroenteritis Resolved.     Hypothyroidism Stable, repeat TSH at 2.516 -Continue Synthroid  Adrenal hemorrhage ([place]) Patient had a CT scan of abdomen and pelvis which showed increased fullness of both adrenal glands with mild periadrenal edema.  Findings could be nonspecific and could indicate adrenal congestion or impending adrenal crisis or adrenal hemorrhage.  A.m. 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 -We will get benefit from outpatient endocrinology evaluation, after going back to baseline     Subjective: Patient seems very frustrated that she is experiencing significant abdominal pain after eating anything.  She was only able to tolerate clear at this time.  Physical Exam: Vitals:   09/27/21 2024 09/28/21 0444 09/28/21 0743 09/28/21 1528  BP: 136/70 (!) 145/66 122/65 132/79  Pulse: 73 74 74 69  Resp: '20 20  18  '$ Temp: 98.8 F (37.1 C) 98.9 F (37.2 C) 98.5 F (36.9 C) 98 F (36.7 C)  TempSrc: Oral Oral  Oral  SpO2: 93% 92% 92% 92%  Weight:      Height:       General.  Well-developed lady, in no acute distress. Pulmonary.  Lungs clear bilaterally, normal respiratory effort. CV.  Regular rate and rhythm, no JVD, rub or murmur. Abdomen.  Soft, nontender, nondistended, BS positive. CNS.  Alert and oriented .  No focal neurologic deficit. Extremities.  No edema, no cyanosis, pulses intact and symmetrical. Psychiatry.  Judgment and insight appears normal.  Data Reviewed: Prior notes, labs and images reviewed  Family Communication:   Disposition: Status is: Inpatient Remains inpatient appropriate because: Severity of illness   Planned Discharge Destination: Home with Home Health  Time spent: 45 minutes  This record has been created using Systems analyst. Errors  have been sought and corrected,but may not always be located. Such creation errors do not reflect on the standard of care.  Author: Lorella Nimrod, MD 09/28/2021 4:03 PM  For on call review [email or web address].

## 2021-09-28 NOTE — Assessment & Plan Note (Signed)
Patient was found to have left upper lobe and lingular pneumonia.  Continue to require oxygen with no baseline oxygen use.  Cough improving.  Procalcitonin started improving.  Multiple drug allergies versus intolerances. Patient refusing to take Zithromax and doxycycline.  Intolerance to fluoroquinolones. -Antibiotics switched to cefpodoxime-To complete a 7-day course -Continue to monitor -Patient will need repeat imaging in 4 to 6 weeks to see the resolution of infiltrates.

## 2021-09-28 NOTE — Plan of Care (Signed)

## 2021-09-28 NOTE — Assessment & Plan Note (Signed)
Patient had a CT scan of abdomen and pelvis which showed increased fullness of both adrenal glands with mild periadrenal edema.  Findings could be nonspecific and could indicate adrenal congestion or impending adrenal crisis or adrenal hemorrhage.  A.m. 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 -We will get benefit from outpatient endocrinology evaluation, after going back to baseline

## 2021-09-28 NOTE — Progress Notes (Signed)
Physical Therapy Treatment Patient Details Name: Christina Bullock MRN: [id number] DOB: December 02, 1955 Today's Date: 09/28/2021   History of Present Illness 66 y.o. female with medical history significant for complicated GI surgical history with multiple abdominal surgeries (her current anatomy appears to be a colorectal anastomosis with bowel continuity )hypothyroidism, COPD, depression who presents to the ER for evaluation of a 2-day history of generalized abdominal pain, nausea, vomiting and diarrhea.  She rated her abdominal pain a 6 x 10 in intensity at its worst.  Pain is nonradiating.    PT Comments    Pt is making gradual progress towards goals with increased ambulation distance this date. RW used in addition to O2 on 4L. Sats decrease to 87% with exertion and HR increases to 135bpm. Able to perform IS and flutter valve during therapy. Able to reach 256m on IS with heavy cues for sequencing. Pt with improvement in functional independence and able to tolerate longer distances. Pt reports she lives in very small apartment and can relay on neighbors and friends for assistance. Due to progress regarding goals, plan to [place] [facility] Discussed with care team. Will continue to progress as able.  Recommendations for follow up therapy are one component of a multi-disciplinary discharge planning process, led by the attending physician.  Recommendations may be updated based on patient status, additional functional criteria and insurance authorization.  Follow Up Recommendations  Home health PT     Assistance Recommended at Discharge Intermittent Supervision/Assistance  Patient can return home with the following A little help with walking and/or transfers;A little help with bathing/dressing/bathroom   Equipment Recommendations       Recommendations for Other Services       Precautions / Restrictions Precautions Precautions: Fall Restrictions Weight Bearing Restrictions: No      Mobility  Bed Mobility               General bed mobility comments: NT, received in recliner at beginning and end of session    Transfers Overall transfer level: Needs assistance Equipment used: Rolling walker (2 wheels) Transfers: Sit to/from Stand Sit to Stand: Min guard           General transfer comment: safe technique with upright posture    Ambulation/Gait Ambulation/Gait assistance: Min guard Gait Distance (Feet): 120 Feet Assistive device: Rolling walker (2 wheels) Gait Pattern/deviations: Step-through pattern       General Gait Details: very slow gait speed with no dizziness. RW used with safe technique. All mobility performed on 4L of O2 with sats decreasing to 87%. Rates at 5/10 RPE with exertion. HR increases to 135bpm with exeriton   SChief Strategy Officer   Modified Rankin (Stroke Patients Only)       Balance Overall balance assessment: Needs assistance Sitting-balance support: Feet supported Sitting balance-Leahy Scale: Good     Standing balance support: During functional activity Standing balance-Leahy Scale: Fair                              Cognition Arousal/Alertness: Awake/alert Behavior During Therapy: WFL for tasks assessed/performed Overall Cognitive Status: Within Functional Limits for tasks assessed                                          Exercises  General Comments        Pertinent Vitals/Pain Pain Assessment Pain Assessment: No/denies pain    Home Living                          Prior Function            PT Goals (current goals can now be found in the care plan section) Acute Rehab PT Goals Patient Stated Goal: to get home. PT Goal Formulation: With patient Time For Goal Achievement: 10/09/21 Potential to Achieve Goals: Good Progress towards PT goals: Progressing toward goals    Frequency    Min 2X/week      PT Plan  Discharge plan needs to be updated    Co-evaluation              AM-PAC PT "6 Clicks" Mobility   Outcome Measure  Help needed turning from your back to your side while in a flat bed without using bedrails?: None Help needed moving from lying on your back to sitting on the side of a flat bed without using bedrails?: None Help needed moving to and from a bed to a chair (including a wheelchair)?: A Little Help needed standing up from a chair using your arms (e.g., wheelchair or bedside chair)?: A Little Help needed to walk in hospital room?: A Little Help needed climbing 3-5 steps with a railing? : A Little 6 Click Score: 20    End of Session Equipment Utilized During Treatment: Gait belt;Oxygen Activity Tolerance: Patient tolerated treatment well Patient left: in chair;with chair alarm set Nurse Communication: Mobility status PT Visit Diagnosis: Unsteadiness on feet (R26.81);Other abnormalities of gait and mobility (R26.89);Muscle weakness (generalized) (M62.81);Difficulty in walking, not elsewhere classified (R26.2)     Time: 9675-9163 PT Time Calculation (min) (ACUTE ONLY): 23 min  Charges:  $Gait Training: 23-37 mins                     Greggory Stallion, PT, DPT, GCS [phone]    Christina Bullock 09/28/2021, 3:27 PM

## 2021-09-29 DIAGNOSIS — K529 Noninfective gastroenteritis and colitis, unspecified: Secondary | ICD-10-CM | POA: Diagnosis not present

## 2021-09-29 DIAGNOSIS — E039 Hypothyroidism, unspecified: Secondary | ICD-10-CM

## 2021-09-29 DIAGNOSIS — R109 Unspecified abdominal pain: Secondary | ICD-10-CM

## 2021-09-29 DIAGNOSIS — E2749 Other adrenocortical insufficiency: Secondary | ICD-10-CM

## 2021-09-29 DIAGNOSIS — R1010 Upper abdominal pain, unspecified: Secondary | ICD-10-CM

## 2021-09-29 DIAGNOSIS — R197 Diarrhea, unspecified: Secondary | ICD-10-CM

## 2021-09-29 DIAGNOSIS — R0602 Shortness of breath: Secondary | ICD-10-CM

## 2021-09-29 DIAGNOSIS — R112 Nausea with vomiting, unspecified: Secondary | ICD-10-CM | POA: Diagnosis not present

## 2021-09-29 DIAGNOSIS — J189 Pneumonia, unspecified organism: Secondary | ICD-10-CM

## 2021-09-29 MED ORDER — CEFPODOXIME PROXETIL 200 MG PO TABS
200.0000 mg | ORAL_TABLET | Freq: Two times a day (BID) | ORAL | 0 refills | Status: AC
Start: 2021-09-29 — End: 2021-10-02

## 2021-09-29 MED ORDER — PANTOPRAZOLE SODIUM 40 MG PO TBEC: 40.0000 mg | DELAYED_RELEASE_TABLET | Freq: Two times a day (BID) | ORAL | 1 refills | Status: AC

## 2021-09-29 MED ORDER — TRAMADOL HCL 50 MG PO TABS: 50.0000 mg | ORAL_TABLET | Freq: Four times a day (QID) | ORAL | 0 refills | Status: AC | PRN

## 2021-09-29 MED ORDER — PANCRELIPASE (LIP-PROT-AMYL) 36000-114000 UNITS PO CPEP: 72000.0000 [IU] | ORAL_CAPSULE | Freq: Three times a day (TID) | ORAL | 1 refills | Status: AC

## 2021-09-29 MED ORDER — ONDANSETRON HCL 4 MG PO TABS: 4.0000 mg | ORAL_TABLET | Freq: Four times a day (QID) | ORAL | 0 refills | Status: AC | PRN

## 2021-09-29 NOTE — TOC Transition Note (Signed)
Transition of Care [facility]) - CM/SW Discharge Note   Patient Details  Name: Christina Bullock MRN: [id number] Date of Birth: 09-29-1955  [facility]) CM/SW Contact:  Laurena Slimmer, RN Phone Number: 09/29/2021, 12:25 PM   Clinical Narrative:    Spoke with patient in the room. Patient transportation is on the way. Home oxygen is at bedside. Patient stated Alvis Lemmings has already contacted her today. Denied any further needs. TOC signing off.      Barriers to Discharge: Continued Medical Work up   Patient Goals and CMS Choice        Discharge Placement                       Discharge Plan and Services                          HH Arranged: RN, PT, OT [facility] Agency: [place] Date [facility] Agency Contacted: 09/28/21   Representative spoke with at [place]: [place] (SDOH) Interventions     Readmission Risk Interventions     View : No data to display.

## 2021-09-29 NOTE — Discharge Summary (Signed)
Physician Discharge Summary   Patient: Christina Bullock MRN: [id number] DOB: 1955-11-08  Admit date:     09/22/2021  Discharge date: 09/29/21  Discharge Physician: Lorella Nimrod   PCP: Romualdo Bolk, FNP   Recommendations at discharge:   Follow up with PCP Follow up with Gastroenterology. Celiac panel and pancreatic lipase labs pending, GI should be able to address as out patient. Follow up with pulmonogy Follow up with Endocrinology.  Discharge Diagnoses: Principal Problem:   CAP (community acquired pneumonia) Active Problems:   Shortness of breath   Abdominal pain   Gastroenteritis   Hypothyroidism   Adrenal hemorrhage (HCC)   Nausea vomiting and diarrhea  Resolved Problems:   Nausea & vomiting  Hospital Course: Taken from H&P.  Christina Bullock is a 66 y.o. female with medical history significant for complicated GI surgical history with multiple abdominal surgeries (her current anatomy appears to be a colorectal anastomosis with bowel continuity )hypothyroidism, COPD, depression who presents to the ER for evaluation of a 2-day history of generalized abdominal pain, nausea, vomiting and diarrhea.  She rated her abdominal pain a 6 x 10 in intensity at its worst.  Pain is nonradiating. Patient states that she has been unable to keep any food down everything she eats runs right through her.  She denies any recent antibiotic use.  She denies having any fever, no chills, no recent travel or sick contacts, no chest pain, no shortness of breath, no headache, no dizziness, no lightheadedness, no leg swelling, no blurred vision or focal deficit.  On arrival to ED she was hemodynamically stable.  Labs pertinent for creatinine of 1.14 with baseline less than 1, mild leukocytosis at 14.6. CT abdomen and pelvis shows increased fullness of both adrenal glands with mild peri and renal edema.  This is nonspecific and could indicate adrenal congestion or impending adrenal crisis or  adrenal hemorrhage.  Follow-up required. Postsurgical changes of the stomach, small and large bowel with feces filling of a postsurgical right mid abdominal small bowel segment but no significant upstream small bowel dilatation. Mild hepatic steatosis. 2 stable 4 mm left lower lobe nodules. Osteopenia and degenerative change.  A.m. cortisol were elevated at 54.7.  TSH was within normal limit. Patient had pretty much normal EGD and colonoscopy a year ago, biopsies were negative for H. pylori or any malignancy.  5/17: Patient developed some shortness of breath overnight, chest x-ray obtained which shows patchy opacities in left lung field.  Oxygen requirement increased from 2 to 4 L Diarrhea has been resolved, no bowel movement since in the hospital.  Discontinue enteric precautions.  Patient need outpatient endocrinology follow-up.  5/18: Patient continued to feel unwell.  Having nausea and productive cough.  Remained on 3 to 4 L of oxygen with no baseline oxygen use.  5/19: Patient overall seems improving, some improvement in cough.  Continue to require up to 3 L of oxygen.  Patient has a lot of allergies and intolerances to medications.  She does not want Zithromax as it makes her nauseated.  She refused to take Augmentin.  She does not want fluoroquinolone as they also make her sick.  She refused to take Doxy.  Eventually agrees to take cefpodoxime for pneumonia. PT/OT evaluation still pending.  5/20: Patient continued to improve but still requiring 2 to 3 L of oxygen.  PT and OT are recommending rehab but patient would like to go home with home health services and asking to work with PT again today as she  thinks that she might be able to participate more now.  5/21: Patient with worsening abdominal pain, had 1 regular bowel movement yesterday.  No nausea or vomiting.  Unable to tolerate any food because of pain. Patient has an history of multiple abdominal surgeries.  Repeat CT abdomen  ordered.  5/22: Repeat CT abdomen was without any acute abnormality.  Prior findings of lingular pneumonia with mild left pleural effusion was noted. Patient continued to experience abdominal pain after eating.  No nausea or vomiting.  She seems very frustrated.  GI was consulted.  No significant atherosclerosis noted on CT abdomen but might need CTA abdomen and pelvis to rule out any mesenteric ischemia. OT recommendations remained for SNF, PT now recommending home health services.  Patient wants to go home with home health.  5/23: GI evaluated her yesterday.  Multiple differentials, they recommend pancreatic fecal elastase levels due to her history of chronic tobacco use to rule out exocrine pancreatic insufficiency as patient is high risk for chronic pancreatitis. They also recommend checking for celiac panel-which was sent and pending. Patient was started on empiric trial of Creon with each meal and 1 with snack. She was also started on twice daily Protonix. Per GI due to her history of multiple GI surgeries, small intestinal bacterial overgrowth is also on differential and they can empirically treat with Xifaxan 550 mg 3 times daily for 2 weeks if the above work-up is negative. Patient need to follow-up with her primary gastroenterologist, Dr. Haig Prophet closely for further management. As she has pretty normal endoscopy and colonoscopy in 2022, no need to repeat procedures at this time.  Feeling much improved today. She wants to go home, maximum home health services ordered.  She will continue with current medications and follow up with her providers.  Assessment and Plan: * CAP (community acquired pneumonia) Patient was found to have left upper lobe and lingular pneumonia.  Continue to require oxygen with no baseline oxygen use.  Cough improving.  Procalcitonin started improving.  Multiple drug allergies versus intolerances. Patient refusing to take Zithromax and doxycycline.  Intolerance to  fluoroquinolones. -Antibiotics switched to cefpodoxime-To complete a 7-day course -Continue to monitor -Patient will need repeat imaging in 4 to 6 weeks to see the resolution of infiltrates.  Shortness of breath Improving. Patient developed some shortness of breath and hypoxia. Chest x-ray at that time with left-sided patchy opacities.  Worsening cough. COVID-19 and influenza PCR negative, respiratory viral panel negative. CT chest concerning for left upper lobe and lingular pneumonia.  Procalcitonin elevated, which is not trending down.  Unable to tolerate Zithromax and multiple antibiotics intolerance.  Ceftriaxone was switched with cefpodoxime -Continue cefpodoxime. -Unable to tolerate any atypical coverage at this time -Continue with supplemental oxygen-wean as tolerated -Continue with supportive care  Abdominal pain Again developed abdominal pain and unable to tolerate much p.o. intake as all the food worsen her pain.  No nausea or vomiting.  Diarrhea resolved.  Had a regular bowel movements.  Repeat CT abdomen was without any significant abnormality. Might need CTA to rule out mesenteric ischemia. -GI was consulted -Continue with supportive care  Gastroenteritis Resolved.     Hypothyroidism Stable, repeat TSH at 2.516 -Continue Synthroid  Adrenal hemorrhage ([place]) Patient had a CT scan of abdomen and pelvis which showed increased fullness of both adrenal glands with mild periadrenal edema.  Findings could be nonspecific and could indicate adrenal congestion or impending adrenal crisis or adrenal hemorrhage.  A.m. cortisol's levels elevated at 54.7>>41.8  Patient is not hypotensive and does not have hyperkalemia or hyponatremia H&H is also stable Repeat CT abdomen with bilateral adrenal hyperplasia and no mention of any concern of hemorrhage. -We will get benefit from outpatient endocrinology evaluation, after going back to baseline   Pain control - [place] was reviewed. and patient was instructed, not to drive, operate heavy machinery, perform activities at heights, swimming or participation in water activities or provide baby-sitting services while on Pain, Sleep and Anxiety Medications; until their outpatient Physician has advised to do so again. Also recommended to not to take more than prescribed Pain, Sleep and Anxiety Medications.   Consultants: GI Procedures performed: None  Disposition: Home health Diet recommendation:  Discharge Diet Orders (From admission, onward)     Start     Ordered   09/29/21 0000  Diet - low sodium heart healthy        09/29/21 1045           Regular diet DISCHARGE MEDICATION: Allergies as of 09/29/2021       Reactions   Ciprofloxacin Other (See Comments)   Pt reported seizures when taking this medication (confirmed with pt 09/25/21)   Iohexol Anaphylaxis   Phenylephrine Anaphylaxis   Heart races   Albuterol    Albuterol-ipratropium-soybean Lecithin [ipratropium-albuterol] Other (See Comments)   Heart racing   Benadryl [diphenhydramine] Other (See Comments)   Heart racing   Codeine Nausea Only   Contrast Media [iodinated Contrast Media]    Effexor [venlafaxine] Other (See Comments)   Slows her down   Erythromycin Nausea Only   Kenalog [triamcinolone]    Nitrofurantoin Macrocrystal    Other    aectylpropalomine   Sulfa Antibiotics Other (See Comments)   headache   Sulfasalazine    Demerol [meperidine] Anxiety   Floxin [ofloxacin] Anxiety   Stadol [butorphanol] Anxiety        Medication List     STOP taking these medications    busPIRone 5 MG tablet Commonly known as: BUSPAR       TAKE these medications    cefpodoxime 200 MG tablet Commonly known as: VANTIN Take 1 tablet (200 mg total) by mouth every 12 (twelve) hours for 3 days.   cholecalciferol 1000 units tablet Commonly known as: VITAMIN D Take 1,000 Units by mouth daily.    levothyroxine 50 MCG tablet Commonly known as: SYNTHROID Two tabs (121mg) Monday-Friday.  One tab (531m) each week on Saturday and Sunday.   lipase/protease/amylase 36000 UNITS Cpep capsule Commonly known as: CREON Take 2 capsules (72,000 Units total) by mouth 3 (three) times daily with meals.   ondansetron 4 MG tablet Commonly known as: ZOFRAN Take 1 tablet (4 mg total) by mouth every 6 (six) hours as needed for nausea.   pantoprazole 40 MG tablet Commonly known as: PROTONIX Take 1 tablet (40 mg total) by mouth 2 (two) times daily before a meal. What changed:  medication strength how much to take when to take this   traMADol 50 MG tablet Commonly known as: Ultram Take 1 tablet (50 mg total) by mouth every 6 (six) hours as needed.   VITAMIN B-12 IJ Inject as directed.               Durable Medical Equipment  (From admission, onward)           Start     Ordered   09/25/21 1439  For home use only DME oxygen  Once  Question Answer Comment  Length of Need 12 Months   Mode or (Route) Nasal cannula   Liters per Minute 3   Frequency Continuous (stationary and portable oxygen unit needed)   Oxygen conserving device No   Oxygen delivery system Gas      09/25/21 1439            Follow-up Information     Romualdo Bolk, FNP. Schedule an appointment as soon as possible for a visit in 1 week(s).   Specialty: Nurse Practitioner Contact information: [address] [place] [address] [phone]         Lesly Rubenstein, MD. Schedule an appointment as soon as possible for a visit in 1 week(s).   Specialty: Gastroenterology Contact information: [place] [address] [phone]                Discharge Exam: Danley Danker Weights   09/22/21 0021  Weight: 82.6 kg   General.     In no acute distress. Pulmonary.  Lungs clear bilaterally, normal respiratory effort. CV.  Regular rate and rhythm, no JVD, rub or  murmur. Abdomen.  Soft, nontender, nondistended, BS positive. CNS.  Alert and oriented .  No focal neurologic deficit. Extremities.  No edema, no cyanosis, pulses intact and symmetrical. Psychiatry.  Judgment and insight appears normal.   Condition at discharge: stable  The results of significant diagnostics from this hospitalization (including imaging, microbiology, ancillary and laboratory) are listed below for reference.   Imaging Studies: CT ABDOMEN PELVIS WO CONTRAST  Result Date: 09/27/2021 CLINICAL DATA:  66 year old female with acute abdominal pain. EXAM: CT ABDOMEN AND PELVIS WITHOUT CONTRAST TECHNIQUE: Multidetector CT imaging of the abdomen and pelvis was performed following the standard protocol without IV contrast. RADIATION DOSE REDUCTION: This exam was performed according to the departmental dose-optimization program which includes automated exposure control, adjustment of the mA and/or kV according to patient size and/or use of iterative reconstruction technique. COMPARISON:  09/23/2021 CT and prior studies FINDINGS: Please note that parenchymal and vascular abnormalities may be missed as intravenous contrast was not administered. Lower chest: Partial visualization of lingular consolidation again noted. A small to moderate LEFT pleural effusion is again identified. Hepatobiliary: Hepatic steatosis identified without focal hepatic abnormality. The gallbladder is unremarkable. There is no evidence of intrahepatic or extrahepatic biliary dilatation. Pancreas: Unremarkable Spleen: Unremarkable Adrenals/Urinary Tract: The kidneys, adrenal glands and bladder are unremarkable except for mild bilateral adrenal hypertrophy. Stomach/Bowel: Surgical changes along the stomach again noted. There is no evidence of bowel obstruction or pneumoperitoneum. No definite bowel wall thickening is present. No inflammatory changes are identified. Vascular/Lymphatic: Aortic atherosclerosis. No enlarged abdominal  or pelvic lymph nodes. Reproductive: Status post hysterectomy. No adnexal masses. Other: No ascites, focal collection or pneumoperitoneum. Musculoskeletal: No acute or suspicious bony abnormalities are noted. Severe degenerative disc disease and grade 1 spondylolisthesis at L5-S1 again noted. IMPRESSION: 1. No evidence of acute abnormality within the abdomen or pelvis. 2. Lingular consolidation/pneumonia with small to moderate LEFT pleural effusion again noted. 3. Hepatic steatosis. 4. Aortic Atherosclerosis (ICD10-I70.0). Electronically Signed   By: Margarette Canada M.D.   On: 09/27/2021 15:50   CT ABDOMEN PELVIS WO CONTRAST  Result Date: 09/22/2021 CLINICAL DATA:  Abdominal pain and diarrhea progressive over previous 3 days. EXAM: CT ABDOMEN AND PELVIS WITHOUT CONTRAST TECHNIQUE: Multidetector CT imaging of the abdomen and pelvis was performed following the standard protocol without IV contrast. RADIATION DOSE REDUCTION: This exam was performed according  to the departmental dose-optimization program which includes automated exposure control, adjustment of the mA and/or kV according to patient size and/or use of iterative reconstruction technique. COMPARISON:  CT without contrast 02/17/2019 FINDINGS: Lower chest: There are scattered linear scar-like opacities in the lung bases. There is chronic pleural-parenchymal disease in the lateral left base. There are 2 stable 4 mm left lower lobe noncalcified nodules on series 4 axial 10 and axial 14. The remaining lung bases are clear. The cardiac size is normal. There is no pericardial effusion. Hepatobiliary: There is mild hepatic hypodensity consistent with steatosis. No focal lesion is seen without contrast. The gallbladder and bile ducts are unremarkable. Pancreas: No focal abnormality or adjacent edema. Spleen: Normal in size and noncontrast attenuation. Adrenals/Urinary Tract: There is bilateral mild periadrenal edema mild increased nonmasslike prominence of both  adrenal glands. The unenhanced renal cortex is unremarkable. There is no urinary stone or obstruction. There is no bladder thickening. Stomach/Bowel: There is a suture line inferiorly at the distal body of the stomach, seen previously without inflammatory changes. There is small bowel anastomosis right mid abdomen with fecal material in the postsurgical small bowel segment. No small bowel obstruction or inflammatory changes are seen. There is a suture line at the rectosigmoid junction. No findings of acute colitis or diverticulitis. Vascular/Lymphatic: Aortic atherosclerosis. No enlarged abdominal or pelvic lymph nodes. Reproductive: Status post hysterectomy. No adnexal masses. Other: There is no incarcerated hernia, no free air, fluid or hemorrhage. Musculoskeletal: There is osteopenia and degenerative change of the spine, L5-S1 chronic disc collapse and chronic L5-S1 grade 1 spondylolisthesis. There is facet hypertrophy at L4-5 and L5-S1. IMPRESSION: 1. Increased fullness of both adrenal glands with mild periadrenal edema. This is nonspecific and could indicate adrenal congestion or impending adrenal crisis or adrenal hemorrhage. Correlate clinically with the adrenal hormone levels. Follow-up as indicated. 2. Postsurgical changes of the stomach, small and large bowel with feces filling of a postsurgical right mid abdominal small bowel segment but no significant upstream small bowel dilatation. 3. Mild hepatic steatosis. 4. 2 stable 4 mm left lower lobe nodules. 5. Osteopenia and degenerative change. Electronically Signed   By: Telford Nab M.D.   On: 09/22/2021 04:25   CT CHEST WO CONTRAST  Result Date: 09/23/2021 CLINICAL DATA:  Shortness of breath. EXAM: CT CHEST WITHOUT CONTRAST TECHNIQUE: Multidetector CT imaging of the chest was performed following the standard protocol without IV contrast. RADIATION DOSE REDUCTION: This exam was performed according to the departmental dose-optimization program which  includes automated exposure control, adjustment of the mA and/or kV according to patient size and/or use of iterative reconstruction technique. COMPARISON:  Chest x-ray, same date.  Chest CT 07/23/2021 FINDINGS: Cardiovascular: The heart is normal in size. No pericardial effusion. The aorta is normal in caliber. No atherosclerotic calcifications. Mediastinum/Nodes: No mediastinal or hilar mass or lymphadenopathy. Small/borderline pre-vascular and left hilar nodes are likely reactive. The esophagus is grossly normal. Lungs/Pleura: Stable underlying emphysematous changes. Dense left upper lobe and lingular airspace opacity with associated air bronchograms most consistent with polylobar pneumonia. Small to moderate-sized left pleural effusion is noted with some overlying atelectasis. No right lung infiltrates. A few small patchy nodular opacities in the right lower lobe are stable. No right-sided pleural effusion. No worrisome pulmonary lesions. The central tracheobronchial tree is unremarkable. No endobronchial mass is identified. Upper Abdomen: No significant upper abdominal findings. Stable mild nodular thickening of both adrenal glands. Musculoskeletal: No breast masses, supraclavicular or axillary adenopathy. The bony thorax is intact.  IMPRESSION: 1. Dense left upper lobe and lingular airspace opacity with associated air bronchograms most consistent with polylobar pneumonia. 2. Small to moderate-sized left pleural effusion with some overlying atelectasis. 3. Stable underlying emphysematous changes. 4. Stable small right lower lobe pulmonary nodules. 5. No mediastinal or hilar mass or adenopathy. Emphysema (ICD10-J43.9). Electronically Signed   By: Marijo Sanes M.D.   On: 09/23/2021 18:08   DG Chest Port 1 View  Result Date: 09/23/2021 CLINICAL DATA:  Hypoxia. EXAM: PORTABLE CHEST 1 VIEW COMPARISON:  Chest CT 07/23/2021. FINDINGS: The cardiac size is normal. The mediastinal configuration is stable. Layering  pleural effusion of small to moderate size has developed on the left as well as patchy opacity of the left lung fields which could indicate widespread airspace disease or atelectasis related to the pleural effusion. Right lung is clear with COPD change. No right pleural effusion is seen. Thoracic cage is intact. IMPRESSION: Increasing opacity in the left hemithorax at least part of which appears due to a pleural effusion of small or moderate size. I suspect there is probably patchy consolidation throughout the lung fields but some of the opacity could be compressive atelectatic effect. Right lung is clear with COPD. Clinical correlation and radiographic follow-up recommended. Chest CT may be helpful. Electronically Signed   By: Telford Nab M.D.   On: 09/23/2021 07:13    Microbiology: Results for orders placed or performed during the hospital encounter of 09/22/21  Respiratory (~20 pathogens) panel by PCR     Status: None   Collection Time: 09/23/21  5:27 PM   Specimen: Nasopharyngeal Swab; Respiratory  Result Value Ref Range Status   Adenovirus NOT DETECTED NOT DETECTED Final   Coronavirus 229E NOT DETECTED NOT DETECTED Final    Comment: (NOTE) The Coronavirus on the Respiratory Panel, DOES NOT test for the novel  Coronavirus (2019 nCoV)    Coronavirus HKU1 NOT DETECTED NOT DETECTED Final   Coronavirus NL63 NOT DETECTED NOT DETECTED Final   Coronavirus OC43 NOT DETECTED NOT DETECTED Final   Metapneumovirus NOT DETECTED NOT DETECTED Final   Rhinovirus / Enterovirus NOT DETECTED NOT DETECTED Final   Influenza A NOT DETECTED NOT DETECTED Final   Influenza B NOT DETECTED NOT DETECTED Final   Parainfluenza Virus 1 NOT DETECTED NOT DETECTED Final   Parainfluenza Virus 2 NOT DETECTED NOT DETECTED Final   Parainfluenza Virus 3 NOT DETECTED NOT DETECTED Final   Parainfluenza Virus 4 NOT DETECTED NOT DETECTED Final   Respiratory Syncytial Virus NOT DETECTED NOT DETECTED Final   Bordetella  pertussis NOT DETECTED NOT DETECTED Final   Bordetella Parapertussis NOT DETECTED NOT DETECTED Final   Chlamydophila pneumoniae NOT DETECTED NOT DETECTED Final   Mycoplasma pneumoniae NOT DETECTED NOT DETECTED Final    Comment: Performed at [facility], [address], [place], [place] [address]  Resp Panel by RT-PCR (Flu A&B, Covid) Nasopharyngeal Swab     Status: None   Collection Time: 09/23/21  5:27 PM   Specimen: Nasopharyngeal Swab; Nasopharyngeal(NP) swabs in vial transport medium  Result Value Ref Range Status   SARS Coronavirus 2 by RT PCR NEGATIVE NEGATIVE Final    Comment: (NOTE) SARS-CoV-2 target nucleic acids are NOT DETECTED.  The SARS-CoV-2 RNA is generally detectable in upper respiratory specimens during the acute phase of infection. The lowest concentration of SARS-CoV-2 viral copies this assay can detect is 138 copies/mL. A negative result does not preclude SARS-Cov-2 infection and should not be used as the sole basis for  treatment or other patient management decisions. A negative result may occur with  improper specimen collection/handling, submission of specimen other than nasopharyngeal swab, presence of viral mutation(s) within the areas targeted by this assay, and inadequate number of viral copies(<138 copies/mL). A negative result must be combined with clinical observations, patient history, and epidemiological information. The expected result is Negative.  Fact Sheet for Patients:  EntrepreneurPulse.com.au  Fact Sheet for Healthcare Providers:  IncredibleEmployment.be  This test is no t yet approved or cleared by the [place] FDA and  has been authorized for detection and/or diagnosis of SARS-CoV-2 by FDA under an Emergency Use Authorization (EUA). This EUA will remain  in effect (meaning this test can be used) for the duration of the COVID-19 declaration under Section 564(b)(1) of the Act, 21 U.S.C.section  360bbb-3(b)(1), unless the authorization is terminated  or revoked sooner.       Influenza A by PCR NEGATIVE NEGATIVE Final   Influenza B by PCR NEGATIVE NEGATIVE Final    Comment: (NOTE) The Xpert Xpress SARS-CoV-2/FLU/RSV plus assay is intended as an aid in the diagnosis of influenza from Nasopharyngeal swab specimens and should not be used as a sole basis for treatment. Nasal washings and aspirates are unacceptable for Xpert Xpress SARS-CoV-2/FLU/RSV testing.  Fact Sheet for Patients: EntrepreneurPulse.com.au  Fact Sheet for Healthcare Providers: IncredibleEmployment.be  This test is not yet approved or cleared by the [place] FDA and has been authorized for detection and/or diagnosis of SARS-CoV-2 by FDA under an Emergency Use Authorization (EUA). This EUA will remain in effect (meaning this test can be used) for the duration of the COVID-19 declaration under Section 564(b)(1) of the Act, 21 U.S.C. section 360bbb-3(b)(1), unless the authorization is terminated or revoked.  Performed at [facility] [facility], [address] [place], [place] [address]     Labs: CBC: Recent Labs  Lab 09/23/21 0529 09/24/21 0520 09/26/21 0453  WBC 10.8* 7.4 6.2  HGB 13.6 12.6 11.9*  HCT 43.0 39.0 36.7  MCV 94.7 92.9 91.8  PLT 166 151 825   Basic Metabolic Panel: Recent Labs  Lab 09/23/21 0529 09/24/21 0520 09/25/21 0418 09/26/21 0453 09/27/21 0506  NA 137 134* 134* 138 140  K 3.8 3.7 3.6 3.3* 3.5  CL 105 101 99 102 104  CO2 '25 25 27 28 30  '$ GLUCOSE 113* 127* 106* 92 89  BUN '15 21 19 16 16  '$ CREATININE 1.22* 1.21* 1.08* 0.84 0.70  CALCIUM 8.2* 8.5* 8.9 8.9 8.7*  MG  --   --   --  2.2  --    Liver Function Tests: No results for input(s): AST, ALT, ALKPHOS, BILITOT, PROT, ALBUMIN in the last 168 hours. CBG: No results for input(s): GLUCAP in the last 168 hours.  Discharge time spent: greater than 30 minutes.  This record has  been created using Systems analyst. Errors have been sought and corrected,but may not always be located. Such creation errors do not reflect on the standard of care.   Signed: Lorella Nimrod, MD [facility] 09/29/2021

## 2021-09-29 NOTE — Progress Notes (Signed)
PIV removed. AVS reviewed. Patient verbalizes understanding of necessary medication regimen and follow up appointments, as well as correct use of home oxygen.

## 2021-10-01 LAB — CELIAC DISEASE PANEL
Endomysial Ab, IgA: NEGATIVE
IgA: 90 mg/dL (ref 87–352)
Tissue Transglutaminase Ab, IgA: <2 U/mL (ref 0–3)

## 2021-10-13 ENCOUNTER — Ambulatory Visit (INDEPENDENT_AMBULATORY_CARE_PROVIDER_SITE_OTHER): Payer: Medicare HMO | Admitting: Pulmonary Disease

## 2021-10-13 ENCOUNTER — Other Ambulatory Visit (HOSPITAL_COMMUNITY): Payer: Self-pay

## 2021-10-13 ENCOUNTER — Encounter: Payer: Self-pay | Admitting: Pulmonary Disease

## 2021-10-13 ENCOUNTER — Telehealth: Payer: Self-pay | Admitting: Pharmacy Technician

## 2021-10-13 VITALS — BP 130/78 | HR 61 | Temp 97.8°F | Ht 66.0 in | Wt 175.8 lb

## 2021-10-13 DIAGNOSIS — J9601 Acute respiratory failure with hypoxia: Secondary | ICD-10-CM | POA: Diagnosis not present

## 2021-10-13 DIAGNOSIS — J449 Chronic obstructive pulmonary disease, unspecified: Secondary | ICD-10-CM | POA: Diagnosis not present

## 2021-10-13 DIAGNOSIS — J690 Pneumonitis due to inhalation of food and vomit: Secondary | ICD-10-CM | POA: Diagnosis not present

## 2021-10-13 DIAGNOSIS — Z87891 Personal history of nicotine dependence: Secondary | ICD-10-CM

## 2021-10-13 MED ORDER — LEVALBUTEROL TARTRATE 45 MCG/ACT IN AERO: 2.0000 | INHALATION_SPRAY | Freq: Three times a day (TID) | RESPIRATORY_TRACT | 12 refills | Status: AC | PRN

## 2021-10-13 MED ORDER — TRELEGY ELLIPTA 100-62.5-25 MCG/ACT IN AEPB: 1.0000 | INHALATION_SPRAY | Freq: Every day | RESPIRATORY_TRACT | 0 refills | Status: DC

## 2021-10-13 NOTE — Progress Notes (Signed)
Subjective:    Patient ID: Christina Bullock, female    DOB: 26-Aug-1955, 66 y.o.   MRN: [id number] Patient Care Team: Olena Leatherwood, FNP as PCP - General (Nurse Practitioner)  Chief Complaint  Patient presents with   pulmonary consult    Recent admission-    HPI The patient is a 66 year old former smoker who presents after a hospital admission for pneumonia between 16 May through 29 Sep 2021.  The patient was referred by Greig Right of the hospitalist service at [facility].  The patient initially presented to the hospital with abdominal pain nausea, vomiting and a picture consistent with gastroenteritis.  She was also noted to have adrenal hemorrhage on imaging performed during her hospitalization.  The patient was also noted to have a left upper lobe pneumonia, likely due to aspiration and was treated for the same.  She has significant leukocytosis as well.  Because of her pneumonia she was noted to have significant hypoxia and required oxygen between 2 to 3 L/min to maintain adequate oxygenation.  She was instructed to establish with pulmonary to determine ongoing need for oxygen.  She states she would like to come off of oxygen however currently does note that this is helping her.  Given that she was just discharged this will need to be reassessed on follow-up appointment.  She does not endorse any fevers, chills or sweats since being discharged from the hospital.  She has been using an incentive spirometer but does not have a flutter valve.  She still notes congestion in her chest.  We reviewed her CT scans of the chest performed during her admission she had very dense consolidation of the left upper lobe.  She has not had follow-up imaging since then.  She has not had any chest pain, orthopnea or paroxysmal nocturnal dyspnea.  No lower extremity edema or calf tenderness.  Overall she feels she is improving.  She is a former smoker with no evidence of relapse.  She does not endorse any other  symptomatology today.  I reviewed all of her records from her admission.   Review of Systems A 10 point review of systems was performed and it is as noted above otherwise negative.  Past Medical History:  Diagnosis Date   Anxiety    Cancer (HCC)    endometrial   Depression    Gastritis    Hypothyroidism    Mitral prolapse    Pulmonary emphysema (HCC)    Thyroid disease    Past Surgical History:  Procedure Laterality Date   BLADDER REMOVAL     COLON SURGERY     COLONOSCOPY     COLONOSCOPY WITH PROPOFOL N/A 06/10/2020   Procedure: COLONOSCOPY WITH PROPOFOL;  Surgeon: Regis Bill, MD;  Location: [facility] ENDOSCOPY;  Service: Endoscopy;  Laterality: N/A;   COLOSTOMY     COLOSTOMY REVERSAL     ESOPHAGOGASTRODUODENOSCOPY (EGD) WITH PROPOFOL N/A 06/10/2020   Procedure: ESOPHAGOGASTRODUODENOSCOPY (EGD) WITH PROPOFOL;  Surgeon: Regis Bill, MD;  Location: [facility] ENDOSCOPY;  Service: Endoscopy;  Laterality: N/A;   ILEOSTOMY     THYROID SURGERY     UPPER GI ENDOSCOPY     Patient Active Problem List   Diagnosis Date Noted   Abdominal pain 09/27/2021   CAP (community acquired pneumonia) 09/25/2021   Nausea vomiting and diarrhea 09/23/2021   Shortness of breath 09/23/2021   Gastroenteritis 09/22/2021   Hypothyroidism    Adrenal hemorrhage (HCC)    Family History  Adopted: Yes  Problem  Relation Age of Onset   Breast cancer Neg Hx    Social History   Tobacco Use   Smoking status: Every Day    Packs/day: 1.00    Years: 50.00    Pack years: 50.00    Types: Cigarettes   Smokeless tobacco: Never  Substance Use Topics   Alcohol use: No   Allergies  Allergen Reactions   Ciprofloxacin Other (See Comments)    Pt reported seizures when taking this medication (confirmed with pt 09/25/21)   Iohexol Anaphylaxis   Phenylephrine Anaphylaxis    Heart races   Albuterol    Albuterol-Ipratropium-Soybean Lecithin [Ipratropium-Albuterol] Other (See Comments)    Heart racing    Benadryl [Diphenhydramine] Other (See Comments)    Heart racing   Codeine Nausea Only   Contrast Media [Iodinated Contrast Media]    Effexor [Venlafaxine] Other (See Comments)    Slows her down   Erythromycin Nausea Only   Kenalog [Triamcinolone]    Nitrofurantoin Macrocrystal    Other     aectylpropalomine   Sulfa Antibiotics Other (See Comments)    headache   Sulfasalazine    Demerol [Meperidine] Anxiety   Floxin [Ofloxacin] Anxiety   Stadol [Butorphanol] Anxiety   Current Meds  Medication Sig   cholecalciferol (VITAMIN D) 1000 units tablet Take 1,000 Units by mouth daily.   Cyanocobalamin (VITAMIN B-12 IJ) Inject as directed.   levothyroxine (SYNTHROID) 50 MCG tablet Two tabs ( ) Monday-Friday.  One tab ( ) each week on Saturday and Sunday.   lipase/protease/amylase (CREON) 36000 UNITS CPEP capsule Take 2 capsules (72,000 Units total) by mouth 3 (three) times daily with meals.   ondansetron (ZOFRAN) 4 MG tablet Take 1 tablet (4 mg total) by mouth every 6 (six) hours as needed for nausea.   pantoprazole (PROTONIX) 40 MG tablet Take 1 tablet (40 mg total) by mouth 2 (two) times daily before a meal.   traMADol (ULTRAM) 50 MG tablet Take 1 tablet (50 mg total) by mouth every 6 (six) hours as needed.    There is no immunization history on file for this patient.      Objective:   Physical Exam BP 130/78 (BP Location: Left Arm, Cuff Size: Normal)   Pulse 61   Temp 97.8 F (36.6 C) (Temporal)   Ht 5\' 6"  (1.676 m)   Wt 175 lb 12.8 oz (79.7 kg)   SpO2 96%   BMI 28.37 kg/m   SpO2: 96 % O2 Device: Nasal cannula O2 Flow Rate (L/min): 3 L/min O2 Type: Continuous O2  GENERAL: Well-developed, well-nourished woman, no acute distress, comfortable on nasal cannula O2. HEAD: Normocephalic, atraumatic.  EYES: Pupils equal, round, reactive to light.  No scleral icterus.  MOUTH: Poor dentition, missing teeth, chipped. NECK: Supple. No thyromegaly. Trachea midline. No JVD.   No adenopathy. PULMONARY: Good air entry bilaterally.  Scattered rhonchi throughout, no wheezes CARDIOVASCULAR: S1 and S2. Regular rate and rhythm.  No rubs, murmurs or gallops heard. ABDOMEN: Benign. MUSCULOSKELETAL: No joint deformity, no clubbing, no edema.  NEUROLOGIC: No overt focal deficit, no gait disturbance, speech is fluent. SKIN: Intact,warm,dry. PSYCH: Mood and behavior normal.  Representative image from CT performed 23 Sep 2021 showing dense left upper lobe infiltrate with air bronchograms:        Assessment & Plan:     ICD-10-CM   1. COPD, moderate (HCC)  J44.9 DG Chest 2 View    AMB REFERRAL FOR DME   Will need to reassess with PFTs once recovered Trial  of Trelegy 100, 1 inhalation daily As needed levo albuterol    2. Acute respiratory failure with hypoxia (HCC)  J96.01    Continue oxygen for now Reassess on follow-up appointment    3. Aspiration pneumonia of left upper lobe due to vomit (HCC)  J69.0 Flutter valve   Patient had significant N/V on admission Etiology of pneumonia likely aspiration Chest x-ray to follow-up    4. Former smoker  Z87.891    No evidence of relapse     Orders Placed This Encounter  Procedures   DG Chest 2 View    Standing Status:   Future    Standing Expiration Date:   10/14/2022    Order Specific Question:   Reason for Exam (SYMPTOM  OR DIAGNOSIS REQUIRED)    Answer:   f/u PNA    Order Specific Question:   Preferred [facility] location?    [facility]:    [facility]   AMB REFERRAL FOR DME    Referral Priority:   Routine    Referral Type:   Durable Medical Equipment Purchase    Number of Visits Requested:   1   Flutter valve    Standing Status:   Future    Standing Expiration Date:   10/14/2022   Meds ordered this encounter  Medications   levalbuterol (XOPENEX HFA) 45 MCG/ACT inhaler    Sig: Inhale 2 puffs into the lungs every 8 (eight) hours as needed for wheezing or shortness of breath.    Dispense:  1 each    Refill:  12     Patient does not tolerate albuterol   Fluticasone-Umeclidin-Vilant (TRELEGY ELLIPTA) 100-62.5-25 MCG/ACT AEPB    Sig: Inhale 1 puff into the lungs daily.    Dispense:  14 each    Refill:  0    Order Specific Question:   Lot Number?    Answer:   [id number]    Order Specific Question:   Expiration Date?    Answer:   02/08/2023    Order Specific Question:   Manufacturer?    Answer:   [employer] [[id number]]    Order Specific Question:   Quantity    Answer:   2   Will see the patient in follow-up in 4 to 6 weeks time she is to contact us prior to that time should any new problems arise.  Gailen Shelter, MD Advanced Bronchoscopy P[facility]    *This note was dictated using voice recognition software/Dragon.  Despite best efforts to proofread, errors can occur which can change the meaning. Any transcriptional errors that result from this process are unintentional and may not be fully corrected at the time of dictation.

## 2021-10-13 NOTE — Patient Instructions (Signed)
We sent an inhaler to your pharmacy as you can use as an emergency inhaler if you get short of breath or have wheezing.  This inhaler should hopefully not make your heart race like the other emergency inhaler does.  We are giving you a trial of an inhaler called Trelegy Ellipta 1 inhalation daily.  This is to see if we can loosen up the congestion you have still in your chest.  We are going to check a chest x-ray.  We are giving you a valve to help loosen up mucus from your chest to help your pneumonia clear faster.  We will see you in follow-up in 4 to 6 weeks time with either me or the nurse practitioner at that time.

## 2021-10-14 NOTE — Telephone Encounter (Signed)
Patient can not tolerate albuterol due to tachycardia.

## 2021-10-15 ENCOUNTER — Telehealth: Payer: Self-pay | Admitting: Pulmonary Disease

## 2021-10-15 NOTE — Telephone Encounter (Signed)
Oxygen saturations of 88 to 92% are okay.  She may be able to do 2 L with rest and 3 L with activity.

## 2021-10-15 NOTE — Telephone Encounter (Signed)
Spoke to patient's home health nurse, April. April is wanting to know what range patient's oxygen saturation should be in on 3L?  Dr. Patsey Berthold, please advise.

## 2021-10-15 NOTE — Telephone Encounter (Signed)
Spoke to home health nurse, April. She is aware of below message and voiced her understanding.  Nothing further needed.

## 2021-10-22 ENCOUNTER — Telehealth: Payer: Self-pay | Admitting: Pulmonary Disease

## 2021-10-22 DIAGNOSIS — J449 Chronic obstructive pulmonary disease, unspecified: Secondary | ICD-10-CM

## 2021-10-22 NOTE — Telephone Encounter (Signed)
[place] home nurse is doing a home visit and states md gonzalez doesnt wont pts o2 levels over 96,pt had been off o2 machine for 59mn before nurse got there and o2 levels were at 93, and after turning up to 3liters were 97-98. and turned  o2 liters down to 2 and shes at 97. Please advise

## 2021-10-22 NOTE — Telephone Encounter (Signed)
Lm for April.

## 2021-10-22 NOTE — Telephone Encounter (Signed)
Spoke to home health nurse, April. She stated that patient's spo2 is maintaining around 97-98% on 3L at rest. April decreased to 2L and she maintained around 97%. Prior to April arriving to patient's home, she had been on roomair for about 9mn washing dishes and cleaning. SPO2 93% on roomair at that time.  Dr. GPatsey Berthold please advise. Thanks

## 2021-10-22 NOTE — Telephone Encounter (Signed)
She may be able to be weaned off.  As long as she is maintaining above 90% consistently on room air she can be off of O2.  She would however need an overnight oximetry on room air to make sure that she does not need it at nighttime.

## 2021-10-22 NOTE — Telephone Encounter (Signed)
April, RN is aware of below message/recommendations and voiced her understanding.  ONO ordered. Nothing further needed.

## 2021-10-29 ENCOUNTER — Other Ambulatory Visit (HOSPITAL_COMMUNITY): Payer: Self-pay

## 2021-10-29 NOTE — Telephone Encounter (Signed)
Patient Advocate Encounter   Received notification that prior authorization for Levalbuterol Tartrate 45MCG/ACT aerosol is required.   PA submitted on 10/29/2021 Key [id number] Status is pending

## 2021-11-02 ENCOUNTER — Encounter: Payer: Self-pay | Admitting: Pulmonary Disease

## 2021-11-03 ENCOUNTER — Other Ambulatory Visit (HOSPITAL_COMMUNITY): Payer: Self-pay

## 2021-11-11 ENCOUNTER — Telehealth: Payer: Self-pay | Admitting: Pulmonary Disease

## 2021-11-11 DIAGNOSIS — J449 Chronic obstructive pulmonary disease, unspecified: Secondary | ICD-10-CM

## 2021-11-11 NOTE — Telephone Encounter (Signed)
Patient is aware of results and voiced her understanding.  She would like order placed to d/c daytime oxygen. Per Dr. Patsey Berthold verbally, okay to place order to d/c daytime oxygen. Order has been placed. Nothing further needed.

## 2021-11-11 NOTE — Telephone Encounter (Signed)
Results have been received and placed in Dr. Domingo Dimes folder for review.

## 2021-11-11 NOTE — Telephone Encounter (Signed)
Spoke to patient. She stated that she in fact has ONO 2 weeks ago. She stated that she sent the device back to [facility] via [employer] on 11/03/2021.  Spoke to Tripp with [employer]  and relayed above message. She will send a message to O2 team for follow up.

## 2021-11-11 NOTE — Telephone Encounter (Signed)
I have reviewed the overnight oximetry.  Her oxygen saturations still dropped to 80% during sleep.  It is recommended that she continue using oxygen at nighttime.

## 2021-11-11 NOTE — Telephone Encounter (Signed)
She did not have ONO.  It would be good to have an ONO before all the oxygen is removed from the home.

## 2021-11-11 NOTE — Telephone Encounter (Signed)
Called and spoke to patient.  She would like an order placed to [facility] to d/c oxygen. She stated that she last wore oxygen about one month ago. She has been monitoring oxygen level.  Spo2 maintaining around 96%. She stated that she had ONO weeks ago but has not received results. It does not look like we have received results.  Spoke to [place] with [facility] and requested ONO results.  Melissa stated that ONO has not been completed yet. Patient had POC eval. Melissa will push order.    Dr. Patsey Berthold, please advise. Thanks

## 2021-11-18 ENCOUNTER — Ambulatory Visit
Admission: RE | Admit: 2021-11-18 | Discharge: 2021-11-18 | Disposition: A | Discharge status: Home / Self Care | Payer: Medicare HMO | Source: Ambulatory Visit | Attending: Pulmonary Disease | Admitting: Pulmonary Disease

## 2021-11-18 ENCOUNTER — Ambulatory Visit
Admission: RE | Admit: 2021-11-18 | Discharge: 2021-11-18 | Disposition: A | Discharge status: Home / Self Care | Payer: Medicare HMO | Attending: Pulmonary Disease | Admitting: Pulmonary Disease

## 2021-11-18 DIAGNOSIS — J449 Chronic obstructive pulmonary disease, unspecified: Secondary | ICD-10-CM | POA: Insufficient documentation

## 2021-12-25 ENCOUNTER — Ambulatory Visit (INDEPENDENT_AMBULATORY_CARE_PROVIDER_SITE_OTHER): Payer: Medicare HMO | Admitting: Primary Care

## 2021-12-25 ENCOUNTER — Encounter: Payer: Self-pay | Admitting: Primary Care

## 2021-12-25 VITALS — BP 118/78 | HR 80 | Temp 97.9°F | Ht 66.0 in | Wt 186.2 lb

## 2021-12-25 DIAGNOSIS — G4734 Idiopathic sleep related nonobstructive alveolar hypoventilation: Secondary | ICD-10-CM

## 2021-12-25 DIAGNOSIS — J449 Chronic obstructive pulmonary disease, unspecified: Secondary | ICD-10-CM

## 2021-12-25 MED ORDER — SPIRIVA RESPIMAT 2.5 MCG/ACT IN AERS: 2.0000 | INHALATION_SPRAY | Freq: Every day | RESPIRATORY_TRACT | 0 refills | Status: AC

## 2021-12-25 NOTE — Patient Instructions (Signed)
Recommendations: Start Spiriva Respimat - 2 puffs morning  Use Levalbuterol HFA 2 puffs every 4-6 hours as needed for breakthrough shortness of breath or wheezing  Continue to use incentrive spirometer daily/ flutter valve for congestion   Orders: ONO on room air PFTs   Follow-up: 3 months with Dr. Patsey Berthold

## 2021-12-25 NOTE — Progress Notes (Signed)
@Patient  ID: Christina Bullock, female    DOB: 1955/08/19, 66 y.o.   MRN: [id number]  Chief Complaint  Patient presents with   Follow-up    Occ SOB with exertion and occ wheezing in the morning.     Referring provider: Olena Leatherwood, FNP  HPI: 66 year old female, former smoker. PMH significant for moderate COPD, CAP, hypothyroidism. Patient of Dr. Jayme Cloud, last seen on 10/13/21.  12/25/2021 Patient presents today for 6 week follow-up. She was started on Trelegy and flutter valve during her last OV to help with congestion.  CXR on 11/18/21 showed marked improvement in LUL pneumonia.   She is doing well. She has no acute respiratory symptoms other than morning wheezing. Denies cough or chest congestion. She did not try trelegy inhaler. She does not do well with steriods or albuterol. She uses her incentive spirometer daily. She has not been using flutter valve as much recently since she has no chest congestion. ONO in June 2023 showed patient spent 9 mins with SpO2<88% with low 80%. She has not been wearing oxygen at bedtime despite being instructed to.    Allergies  Allergen Reactions   Ciprofloxacin Other (See Comments)    Pt reported seizures when taking this medication (confirmed with pt 09/25/21)   Iohexol Anaphylaxis   Phenylephrine Anaphylaxis    Heart races   Albuterol    Albuterol-Ipratropium-Soybean Lecithin [Ipratropium-Albuterol] Other (See Comments)    Heart racing   Benadryl [Diphenhydramine] Other (See Comments)    Heart racing   Codeine Nausea Only   Contrast Media [Iodinated Contrast Media]    Effexor [Venlafaxine] Other (See Comments)    Slows her down   Erythromycin Nausea Only   Kenalog [Triamcinolone]    Nitrofurantoin Macrocrystal    Other     aectylpropalomine   Sulfa Antibiotics Other (See Comments)    headache   Sulfasalazine    Demerol [Meperidine] Anxiety   Floxin [Ofloxacin] Anxiety   Stadol [Butorphanol] Anxiety     There is no  immunization history on file for this patient.  Past Medical History:  Diagnosis Date   Anxiety    Cancer (HCC)    endometrial   Depression    Gastritis    Hypothyroidism    Mitral prolapse    Pulmonary emphysema (HCC)    Thyroid disease     Tobacco History: Social History   Tobacco Use  Smoking Status Former   Packs/day: 1.00   Years: 50.00   Additional pack years: 0.00   Total pack years: 50.00   Types: Cigarettes   Start date: 08/2021   Quit date: 09/21/2021   Years since quitting: 0.9  Smokeless Tobacco Never   Counseling given: Not Answered   Outpatient Medications Prior to Visit  Medication Sig Dispense Refill   cholecalciferol (VITAMIN D) 1000 units tablet Take 1,000 Units by mouth daily.     Cyanocobalamin (VITAMIN B-12 IJ) Inject as directed.     levalbuterol (XOPENEX HFA) 45 MCG/ACT inhaler Inhale 2 puffs into the lungs every 8 (eight) hours as needed for wheezing or shortness of breath. 1 each 12   levothyroxine (SYNTHROID) 50 MCG tablet Two tabs ( ) Monday-Friday.  One tab ( ) each week on Saturday and Sunday.     lipase/protease/amylase (CREON) 36000 UNITS CPEP capsule Take 2 capsules (72,000 Units total) by mouth 3 (three) times daily with meals. 270 capsule 1   ondansetron (ZOFRAN) 4 MG tablet Take 1 tablet (4 mg total) by mouth every 6 (  six) hours as needed for nausea. 20 tablet 0   pantoprazole (PROTONIX) 40 MG tablet Take 1 tablet (40 mg total) by mouth 2 (two) times daily before a meal. 60 tablet 1   traMADol (ULTRAM) 50 MG tablet Take 1 tablet (50 mg total) by mouth every 6 (six) hours as needed. 20 tablet 0   Fluticasone-Umeclidin-Vilant (TRELEGY ELLIPTA) 100-62.5-25 MCG/ACT AEPB Inhale 1 puff into the lungs daily. (Patient not taking: Reported on 12/25/2021) 14 each 0   No facility-administered medications prior to visit.    Review of Systems  Review of Systems  Constitutional: Negative.   HENT: Negative.    Respiratory:  Positive for  wheezing. Negative for cough and shortness of breath.     Physical Exam  BP 118/78 (BP Location: Left Arm, Cuff Size: Normal)   Pulse 80   Temp 97.9 F (36.6 C) (Temporal)   Ht 5\' 6"  (1.676 m)   Wt 186 lb 3.2 oz (84.5 kg)   SpO2 96%   BMI 30.05 kg/m  Physical Exam Constitutional:      Appearance: Normal appearance.  HENT:     Head: Normocephalic and atraumatic.     Mouth/Throat:     Mouth: Mucous membranes are moist.     Pharynx: Oropharynx is clear.  Cardiovascular:     Rate and Rhythm: Normal rate and regular rhythm.  Pulmonary:     Effort: Pulmonary effort is normal.     Breath sounds: Normal breath sounds.  Skin:    General: Skin is warm and dry.  Neurological:     General: No focal deficit present.     Mental Status: She is alert and oriented to person, place, and time. Mental status is at baseline.  Psychiatric:        Mood and Affect: Mood normal.        Behavior: Behavior normal.        Thought Content: Thought content normal.        Judgment: Judgment normal.      Lab Results:  CBC    Component Value Date/Time   WBC 6.2 09/26/2021 0453   RBC 4.00 09/26/2021 0453   HGB 11.9 (L) 09/26/2021 0453   HGB 13.2 04/13/2013 1332   HCT 36.7 09/26/2021 0453   HCT 40.4 04/13/2013 1332   PLT 168 09/26/2021 0453   PLT 332 04/13/2013 1332   MCV 91.8 09/26/2021 0453   MCV 83 04/13/2013 1332   MCH 29.8 09/26/2021 0453   MCHC 32.4 09/26/2021 0453   RDW 13.2 09/26/2021 0453   RDW 18.4 (H) 04/13/2013 1332   LYMPHSABS 1.9 05/26/2015 1517   LYMPHSABS 2.2 04/13/2013 1332   MONOABS 0.4 05/26/2015 1517   MONOABS 0.7 04/13/2013 1332   EOSABS 0.2 05/26/2015 1517   EOSABS 0.2 04/13/2013 1332   BASOSABS 0.1 05/26/2015 1517   BASOSABS 0.1 04/13/2013 1332    BMET    Component Value Date/Time   NA 140 09/27/2021 0506   NA 139 04/13/2013 1332   K 3.5 09/27/2021 0506   K 4.2 04/13/2013 1332   CL 104 09/27/2021 0506   CL 106 04/13/2013 1332   CO2 30 09/27/2021 0506    CO2 25 04/13/2013 1332   GLUCOSE 89 09/27/2021 0506   GLUCOSE 102 (H) 04/13/2013 1332   BUN 16 09/27/2021 0506   BUN 14 04/13/2013 1332   CREATININE 0.70 09/27/2021 0506   CREATININE 0.85 04/13/2013 1332   CALCIUM 8.7 (L) 09/27/2021 0506   CALCIUM 9.2 04/13/2013  1332   GFRNONAA >60 09/27/2021 0506   GFRNONAA >60 04/13/2013 1332   GFRAA >60 04/19/2019 1344   GFRAA >60 04/13/2013 1332    BNP No results found for: "BNP"  ProBNP No results found for: "PROBNP"  Imaging: No results found.   Assessment & Plan:   COPD (chronic obstructive pulmonary disease) (HCC) - Doing well, she has not tried Trelegy inhaler d/t steroids  - Start Spiriva Respimat 2 puffs morning; Use Levalbuterol HFA 2 puffs every 4-6 hours as needed for breakthrough shortness of breath or wheezing  - Continue to use incentrive spirometer daily/ flutter valve for congestion   Orders: - PFTs   Follow-up: - 3 months with Dr. Jayme Cloud   Nocturnal hypoxia -  ONO in June 2023 showed patient spent 9 mins with SpO2<88% with low 80%. She has not been wearing oxygen at bedtime - Recommend getting updated ONO on room air     Glenford Bayley, NP 09/06/2022

## 2022-01-14 ENCOUNTER — Telehealth: Payer: Self-pay | Admitting: Primary Care

## 2022-01-14 DIAGNOSIS — J9601 Acute respiratory failure with hypoxia: Secondary | ICD-10-CM

## 2022-01-14 NOTE — Telephone Encounter (Signed)
Patient states that she does not want to do any further testing at this time and she would like [facility] to pick up her oxygen. She has not used oxygen since July. Patient also would like a bariatric referral to Dr. Johnathan Hausen.  Please advise.

## 2022-01-14 NOTE — Telephone Encounter (Signed)
Patient is aware of recommendations and voiced her understanding.  She will f/u PRN.  Referral placed to [facility] per patient request. Nothing further needed.

## 2022-01-14 NOTE — Telephone Encounter (Signed)
Spoke to patient.  She stated that she has not used oxygen since July. She would like an order placed to [facility] to d/c.  She does not wish to proceed with any further testing at this time.  She would like bariatric referral. Recommended that you contact PCP for referral.  Dr. Patsey Berthold, please advise on oxygen. Thanks

## 2022-01-14 NOTE — Telephone Encounter (Signed)
Her overnight oximetry showed that she still needed oxygen at nighttime.  If she wants her oxygen discontinued it would have to be [place] but we can write the order.  If she does not want to do any testing nor use medications recommended she can continue follow-up with her primary physician.  Do not see need to follow-up here if recommendations are not going to be followed.  He would have to discuss with her PCP with regards to referrals to bariatric but I am not sure that her weight or BMI warrant such  a referral.

## 2022-03-25 ENCOUNTER — Ambulatory Visit: Payer: Medicare HMO

## 2022-03-29 ENCOUNTER — Ambulatory Visit: Payer: Medicare HMO | Admitting: Pulmonary Disease

## 2022-04-13 ENCOUNTER — Ambulatory Visit: Payer: Medicare HMO | Attending: Primary Care

## 2022-04-13 DIAGNOSIS — F17210 Nicotine dependence, cigarettes, uncomplicated: Secondary | ICD-10-CM | POA: Diagnosis not present

## 2022-04-13 DIAGNOSIS — J449 Chronic obstructive pulmonary disease, unspecified: Secondary | ICD-10-CM | POA: Insufficient documentation

## 2022-04-13 LAB — PULMONARY FUNCTION TEST ARMC ONLY
DL/VA % pred: 61 %
DL/VA: 2.5 ml/min/mmHg/L
DLCO unc % pred: 59 %
DLCO unc: 12.52 ml/min/mmHg
FEF 25-75 Pre: 0.61 L/sec
FEF2575-%Pred-Pre: 28 %
FEV1-%Pred-Pre: 50 %
FEV1-Pre: 1.3 L
FEV1FVC-%Pred-Pre: 75 %
FEV6-%Pred-Pre: 68 %
FEV6-Pre: 2.2 L
FEV6FVC-%Pred-Pre: 101 %
FVC-%Pred-Pre: 67 %
FVC-Pre: 2.26 L
Pre FEV1/FVC ratio: 58 %
Pre FEV6/FVC Ratio: 98 %
RV % pred: 149 %
RV: 3.33 L
TLC % pred: 114 %
TLC: 6.11 L

## 2022-05-14 ENCOUNTER — Ambulatory Visit: Payer: Medicare HMO

## 2022-07-26 ENCOUNTER — Ambulatory Visit
Admission: RE | Admit: 2022-07-26 | Discharge: 2022-07-26 | Disposition: A | Discharge status: Home / Self Care | Payer: Medicare HMO | Source: Ambulatory Visit | Attending: Acute Care | Admitting: Acute Care

## 2022-07-26 DIAGNOSIS — Z122 Encounter for screening for malignant neoplasm of respiratory organs: Secondary | ICD-10-CM | POA: Insufficient documentation

## 2022-07-26 DIAGNOSIS — F17210 Nicotine dependence, cigarettes, uncomplicated: Secondary | ICD-10-CM | POA: Diagnosis present

## 2022-07-26 DIAGNOSIS — J439 Emphysema, unspecified: Secondary | ICD-10-CM | POA: Insufficient documentation

## 2022-07-26 DIAGNOSIS — Z87891 Personal history of nicotine dependence: Secondary | ICD-10-CM | POA: Diagnosis present

## 2022-07-28 ENCOUNTER — Other Ambulatory Visit: Payer: Self-pay

## 2022-07-28 DIAGNOSIS — Z87891 Personal history of nicotine dependence: Secondary | ICD-10-CM

## 2022-07-28 DIAGNOSIS — F17210 Nicotine dependence, cigarettes, uncomplicated: Secondary | ICD-10-CM

## 2022-09-06 DIAGNOSIS — G4734 Idiopathic sleep related nonobstructive alveolar hypoventilation: Secondary | ICD-10-CM | POA: Insufficient documentation

## 2022-09-06 DIAGNOSIS — J449 Chronic obstructive pulmonary disease, unspecified: Secondary | ICD-10-CM | POA: Insufficient documentation

## 2022-09-06 NOTE — Assessment & Plan Note (Addendum)
-    ONO in June 2023 showed patient spent 9 mins with SpO2<88% with low 80%. She has not been wearing oxygen at bedtime - Recommend getting updated ONO on room air

## 2022-09-06 NOTE — Assessment & Plan Note (Signed)
-   Doing well, she has not tried Trelegy inhaler d/t steroids  - Start Spiriva Respimat 2 puffs morning; Use Levalbuterol HFA 2 puffs every 4-6 hours as needed for breakthrough shortness of breath or wheezing  - Continue to use incentrive spirometer daily/ flutter valve for congestion   Orders: - PFTs   Follow-up: - 3 months with Dr. Jayme Cloud

## 2022-10-13 ENCOUNTER — Encounter: Payer: Self-pay | Admitting: Pulmonary Disease

## 2023-07-14 ENCOUNTER — Telehealth: Payer: Self-pay

## 2023-07-14 NOTE — Telephone Encounter (Signed)
-----   Message from Nurse Melchor Amour sent at 07/14/2023  3:47 PM EST ----- Regarding: Schedule Annual CT Confirm patient completed antibiotics and schedule annual CT. Due 07/28/2023. Started antibiotics 07/14/2023.

## 2023-07-14 NOTE — Telephone Encounter (Signed)
 Spoke with patient. Advises she has pneumonia and will be starting antibiotics today. 07/29/2023 CT cancelled. Advised we will follow up in 2 weeks and confirm she is better and schedule annual CT one month after treatment is completed.

## 2023-07-28 ENCOUNTER — Other Ambulatory Visit: Payer: Self-pay

## 2023-07-28 ENCOUNTER — Telehealth: Payer: Self-pay

## 2023-07-28 ENCOUNTER — Ambulatory Visit

## 2023-07-28 DIAGNOSIS — Z122 Encounter for screening for malignant neoplasm of respiratory organs: Secondary | ICD-10-CM

## 2023-07-28 DIAGNOSIS — R911 Solitary pulmonary nodule: Secondary | ICD-10-CM

## 2023-07-28 DIAGNOSIS — Z87891 Personal history of nicotine dependence: Secondary | ICD-10-CM

## 2023-07-28 NOTE — Telephone Encounter (Signed)
 Patient has completed treatment. One month f/u scan scheduled for 08/24/2023.

## 2023-08-24 ENCOUNTER — Ambulatory Visit
Admission: RE | Admit: 2023-08-24 | Discharge: 2023-08-24 | Disposition: A | Discharge status: Home / Self Care | Source: Ambulatory Visit | Attending: Acute Care | Admitting: Acute Care

## 2023-08-24 DIAGNOSIS — I700 Atherosclerosis of aorta: Secondary | ICD-10-CM | POA: Insufficient documentation

## 2023-08-24 DIAGNOSIS — R911 Solitary pulmonary nodule: Secondary | ICD-10-CM | POA: Diagnosis present

## 2023-08-24 DIAGNOSIS — J439 Emphysema, unspecified: Secondary | ICD-10-CM | POA: Insufficient documentation

## 2023-08-24 DIAGNOSIS — Z87891 Personal history of nicotine dependence: Secondary | ICD-10-CM | POA: Insufficient documentation

## 2023-08-24 DIAGNOSIS — Z122 Encounter for screening for malignant neoplasm of respiratory organs: Secondary | ICD-10-CM | POA: Diagnosis present

## 2023-09-22 ENCOUNTER — Other Ambulatory Visit: Payer: Self-pay

## 2023-09-22 DIAGNOSIS — F17210 Nicotine dependence, cigarettes, uncomplicated: Secondary | ICD-10-CM

## 2023-09-22 DIAGNOSIS — Z122 Encounter for screening for malignant neoplasm of respiratory organs: Secondary | ICD-10-CM

## 2023-09-22 DIAGNOSIS — Z87891 Personal history of nicotine dependence: Secondary | ICD-10-CM

## 2024-03-21 ENCOUNTER — Telehealth: Payer: Self-pay | Admitting: Gastroenterology

## 2024-03-21 NOTE — Telephone Encounter (Signed)
 Good Morning Dr. Leigh,  We have received a referral for patient to be seen with our practice for a colonoscopy. We have also received records from patients previous gastro [facility] from 2022 colonoscopy. Patient is requesting to transfer her care to you and is wanting  to transfer due to wanting to be seen with you. Patients records are in epic and we also have additional records that would not transition from Onbase to her chart that I will sent up for you to review as well.   Please review and advise on scheduling patient.  Thank you.

## 2024-03-21 NOTE — Telephone Encounter (Signed)
 I can see her in the office for a new patient visit. Thanks

## 2024-03-23 ENCOUNTER — Encounter: Payer: Self-pay | Admitting: Gastroenterology

## 2024-03-23 NOTE — Telephone Encounter (Signed)
 Patient has been scheduled for 05/29/24 at 1:20 with Dr. Leigh

## 2024-05-29 ENCOUNTER — Ambulatory Visit: Admitting: Gastroenterology

## 2024-05-29 NOTE — Progress Notes (Unsigned)
 "  HPI :  She has a complicated gastrointestinal surgical history which seems to date back to the mid 1980s when she reports having multiple surgeries for what sounds like endometriosis which was adherent to the colon. 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 She underwent 2 small bowel resections and resection of the pelvic abscess cavity with an associated injury to the bladder which was repaired primarily. Her care was then transferred to [facility] and she has been followed closely by Dr. Tonita. 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 She ultimately required a left hemicolectomy and repair of enterovesicular fistula. She did have a diverting loop ileostomy for a period of time which has since been taken down. Her current anatomy appears to be a colorectal anastomosis with bowel continuity otherwise.-This history was copied from Dr Vila note at [facility]  Followed up with another GI provider Dr. Julien where she had negative testing for SIBO  Summary of evaluation: 08/18/11 Colonoscopy: Findings:  The perianal and digital rectal examinations were normal. The transverse  and ascending colon and cecum appeared normal. Diffuse mild inflammation  characterized by erythema and loss of vascularity was found in the  descending and sigmoid colon. The rectum was normal but visualization  was limited secondary to mucus.  Impression: - The transverse and ascending colon is normal.  - Diffuse mild inflammation was found in the descending  and sigmoid colon secondary to diversion colitis.  Flex Sigmoidoscopy 03/1912 Findings:  The digital rectal exam was abnormal. Findings include solid  stool in  rectal vault. Fecal disimpaction was performed.A large amount of solid  stool was found from rectum to transverse colon, interfering with  visualization. Lavage of the area was performed using a small amount of  tap water, resulting in incomplete clearance with fair visualization.  There was e  vidence of a prior end-to-end colo-rectal anastomosis in the  rectum. This was widely patent. This was characterized by healthy  appearing mucosa. This was traversed easily.  Impression: - Solid stool in rectal vault.  - Stool from rectum to transverse colon.  - Patent end-to-end colo-rectal anastomosis. No stenosis  was present. Dilation not indicated.  Upper GI series with small bowel follow-through 07/12/2013 FINDINGS:  The scout image re-demonstrates multiple suture lines in the abdomen and pelvis. The visualized bowel gas pattern is nonobstructive. A calcified phlebolith is seen in the pelvis. No abnormal soft tissue masses are identified. Minimal subsegmental atelectasis in the left lung base. No acute osseous abnormalities identified. The patient was given barium contrast to drink. The esophagus, stomach and duodenum were normal in appearance. Contrast moved readily into the small bowel where the barium column was followed on thru to the colon. Transit time to the colon was approximately 1 hour. The small bowel was normally rotated. No obstructing, constricting or fixed polypoid defects are noted. There is no evidence of ulceration. Views of the TI are unremarkable.  IMPRESSION: Normal upper GI and small bowel follow through as above. No evidence of stricture.  Colonoscopy 07/09/2013: Findings: The perianal and digital rectal examinations were normal. There was evidence of a prior end-to-end colo-colonic anastomosis in the  mid rectum. This was patent. This was characterized by healthy appearing  mucosa. This  was traversed. The terminal ileum appeared normal. Impression: - Patent  end-to-end colo-colonic anastomosis. - The examined portion of the ileum was normal.  Anorectal Manometry 11/2016:  Findings:  ANAL SPHINCTER Station Pull-Through:  RESTING: Resting Pressure (Mean): 47 mmHg.  SQUEEZE:  - Squeeze Pressure (Maximum): 65 mmHg.  RAIR (Rectoanal Inhibitory Response) Study:  - RAIR Threshold: 20 mL.  SENSATION Study:  - Sensation Threshold: 20 mL.  - Urge Threshold: 80 mL.  - Pain Threshold (Maximum Tolerated Volume): 100 mL.  DEFECATION OF BALLOON:  - Unable to expel 50 mL balloon.  PELVIC FLOOR EMG ACTIVITY:  - Resting: 5 u.V.  - Maximal squeeze: 9 u.V.  - Response to straining: Slight increase.  Normal Anorectal Manometry Values:  Resting pressure 40 - 90 mmHg, Maximal squeeze pressure > or = 125 mmHg,  First sensation threshold < or = 10 ml, RAIR threshold < or = 30 ml ,  Urge threshold 80-150 ml, Maximal tolerable volume 100 - 350 ml,  Response to straining = decrease, Abdominal pressure with straining =  increase.  Normal Pelvic Floor EMG Activity Values:  Lowest at rest < or = 5 microvolts (uV), Maximal squeeze > or = 30 uV,  Straining = decrease.   Impression: - Resting study reveals a normal internal anal sphincter  pressure.  - Squeeze study reveals a weak external anal sphincter  pressure.  - RAIR (Rectoanal Inhibitory Reflex) is present suggesting  absence of Hirschsprung's disease.  - Sensation study reveals an elevated (delayed) first  sensation threshold with normal urge and maximal tolerated  volume thresholds.  - Unable to expel defecation balloon.  - Low maximal squeeze activity on EMG.  - Strain manuever reveals an increase in pelvic floor  activity with strain.  Recommendation: - Biofeedback for pelvic floor retraining with continence  strategies.    Christina Bullock is a 69 y.o. female presenting for follow up: Diagnoses and all orders for this visit:  History of abdominal surgery - Ambulatory Referral to Colorectal  Surgery  I spoke with patient that typically when we refer patient's for surgical consultation, they have a problem that might require a surgery. There's no guarantee that they will see her but placed referral at request of patient. I also spoke about repeat colonoscopy given positive FIT and inadequate prep on her last colonoscopy. She is not interested in repeating colonoscopy at this time. Will readdress in six months.       Past Medical History:  Diagnosis Date   Anxiety    Cancer [facility])    endometrial   CHF (congestive heart failure) (HCC)    Depression    Gastritis    Hypothyroidism    Mitral prolapse    Myocardial infarction [facility]) 2003   Pulmonary emphysema (HCC)    Thyroid disease      Past Surgical History:  Procedure Laterality Date   BLADDER REMOVAL     COLON SURGERY     COLONOSCOPY WITH PROPOFOL  N/A 06/10/2020   Procedure: COLONOSCOPY WITH PROPOFOL ;  Surgeon: Maryruth Ole DASEN, MD;  Location: [facility] ENDOSCOPY;  Service: Endoscopy;  Laterality: N/A;   COLOSTOMY     COLOSTOMY REVERSAL     ESOPHAGOGASTRODUODENOSCOPY (EGD) WITH PROPOFOL  N/A 06/10/2020   Procedure: ESOPHAGOGASTRODUODENOSCOPY (EGD) WITH PROPOFOL ;  Surgeon: Maryruth Ole DASEN, MD;  Location: [facility] ENDOSCOPY;  Service: Endoscopy;  Laterality: N/A;   ILEOSTOMY     THYROID SURGERY     UPPER GI ENDOSCOPY     Family History  Adopted: Yes  Problem Relation Age of Onset   Breast cancer Neg Hx    Social History[1] Current Outpatient Medications  Medication Sig Dispense Refill   cholecalciferol  (VITAMIN D ) 1000 units tablet Take 1,000 Units by mouth daily.     Cyanocobalamin (VITAMIN B-12 IJ) Inject as directed.     levalbuterol  (XOPENEX  HFA) 45 MCG/ACT inhaler Inhale 2 puffs into the lungs every 8 (eight) hours as needed for wheezing or shortness of breath. 1 each 12   levothyroxine  (SYNTHROID ) 50 MCG tablet Two tabs ( ) Monday-Friday.  One tab ( ) each week on Saturday and Sunday.      lipase/protease/amylase (CREON ) 36000 UNITS CPEP capsule Take 2 capsules (72,000 Units total) by mouth 3 (three) times daily with meals. 270 capsule 1   ondansetron  (ZOFRAN ) 4 MG tablet Take 1 tablet (4 mg total) by mouth every 6 (six) hours as needed for nausea. 20 tablet 0   pantoprazole  (PROTONIX ) 40 MG tablet Take 1 tablet (40 mg total) by mouth 2 (two) times daily before a meal. 60 tablet 1   Tiotropium Bromide Monohydrate  (SPIRIVA  RESPIMAT) 2.5 MCG/ACT AERS Inhale 2 puffs into the lungs daily. 4 g 0   No current facility-administered medications for this visit.   Allergies[2]   Review of Systems: All systems reviewed and negative except where noted in HPI.    No results found.  Physical Exam: There were no vitals taken for this visit. Constitutional: Pleasant,well-developed, ***female in no acute distress. HEENT: Normocephalic and atraumatic. Conjunctivae are normal. No scleral icterus. Neck supple.  Cardiovascular: Normal rate, regular rhythm.  Pulmonary/chest: Effort normal and breath sounds normal. No wheezing, rales or rhonchi. Abdominal: Soft, nondistended, nontender. Bowel sounds active throughout. There are no masses palpable. No hepatomegaly. Extremities: no edema Lymphadenopathy: No cervical adenopathy noted. Neurological: Alert and oriented to person place and time. Skin: Skin is warm and dry. No rashes noted. Psychiatric: Normal mood and affect. Behavior is normal.   ASSESSMENT: 69 y.o. female here for assessment of the following  No diagnosis found.  PLAN:   Care, [facility]    [1]  Social History Tobacco Use   Smoking status: Former    Current packs/day: 0.00    Average packs/day: 1 pack/day for 50.0 years (50.0 ttl pk-yrs)    Types: Cigarettes    Start date: 08/2021    Quit date: 09/21/2021    Years since quitting: 2.6   Smokeless tobacco: Never  Vaping Use   Vaping status: Never Used  Substance Use Topics   Alcohol use: No   Drug use:  No  [2]  Allergies Allergen Reactions   Ciprofloxacin Other (See Comments)    Pt reported seizures when taking this medication (confirmed with pt 09/25/21)   Iohexol  Anaphylaxis   Phenylephrine Anaphylaxis    Heart races   Albuterol    Albuterol-Ipratropium-Soybean Lecithin [Ipratropium-Albuterol] Other (See Comments)    Heart racing   Benadryl [Diphenhydramine] Other (See Comments)    Heart racing   Codeine Nausea Only   Contrast Media [Iodinated Contrast Media]    Effexor [Venlafaxine] Other (See Comments)    Slows her down   Erythromycin Nausea Only   Kenalog [Triamcinolone]    Nitrofurantoin Macrocrystal    Other     aectylpropalomine   Sulfa Antibiotics Other (See Comments)    headache   Sulfasalazine    Demerol [Meperidine] Anxiety   Floxin [Ofloxacin] Anxiety   Stadol [Butorphanol] Anxiety   "

## 2024-06-22 ENCOUNTER — Ambulatory Visit: Admitting: Gastroenterology
